# Patient Record
Sex: Male | Born: 1975 | Race: White | Hispanic: No | Marital: Married | Smoking: Former smoker
Health system: Southern US, Community
[De-identification: ages and names within clinical notes are randomized; demographics above are authoritative.]

## PROBLEM LIST (undated history)

## (undated) DIAGNOSIS — I10 Essential (primary) hypertension: Secondary | ICD-10-CM

## (undated) DIAGNOSIS — K219 Gastro-esophageal reflux disease without esophagitis: Secondary | ICD-10-CM

## (undated) HISTORY — PX: OTHER SURGICAL HISTORY: SHX169

---

## 2015-07-17 ENCOUNTER — Other Ambulatory Visit: Payer: Self-pay | Admitting: Internal Medicine

## 2015-07-17 ENCOUNTER — Ambulatory Visit
Admission: RE | Admit: 2015-07-17 | Discharge: 2015-07-17 | Disposition: A | Discharge status: Home / Self Care | Payer: 59 | Source: Ambulatory Visit | Attending: Internal Medicine | Admitting: Internal Medicine

## 2015-07-17 DIAGNOSIS — R600 Localized edema: Secondary | ICD-10-CM

## 2016-12-13 DIAGNOSIS — E291 Testicular hypofunction: Secondary | ICD-10-CM | POA: Diagnosis not present

## 2016-12-13 DIAGNOSIS — Z79899 Other long term (current) drug therapy: Secondary | ICD-10-CM | POA: Diagnosis not present

## 2016-12-20 DIAGNOSIS — Z79899 Other long term (current) drug therapy: Secondary | ICD-10-CM | POA: Diagnosis not present

## 2016-12-20 DIAGNOSIS — E291 Testicular hypofunction: Secondary | ICD-10-CM | POA: Diagnosis not present

## 2017-01-28 IMAGING — US US EXTREM LOW VENOUS*L*
1 series · 13 of 24 positions shown · non-contrast
Comparison: None.

CLINICAL DATA: Left lower extremity pain and edema. Evaluate for
DVT.



[Series 1: us extrem low venous*left* · 13 of 42 slices shown]
[im 1/42]
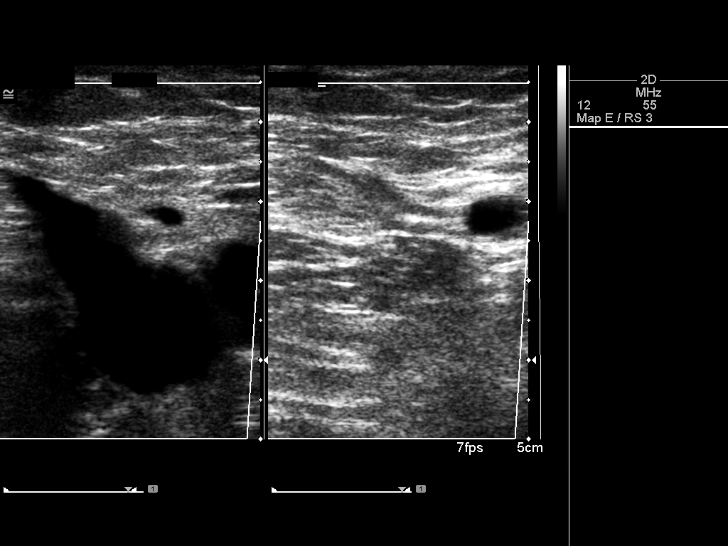
[im 4/42]
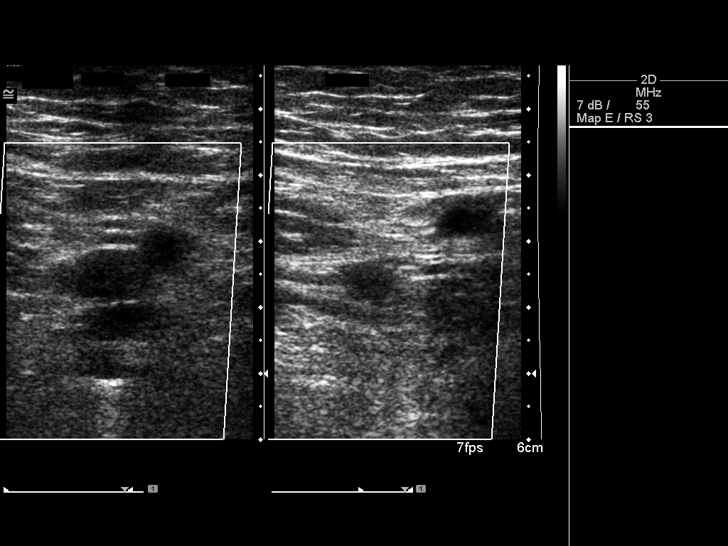
[im 8/42]
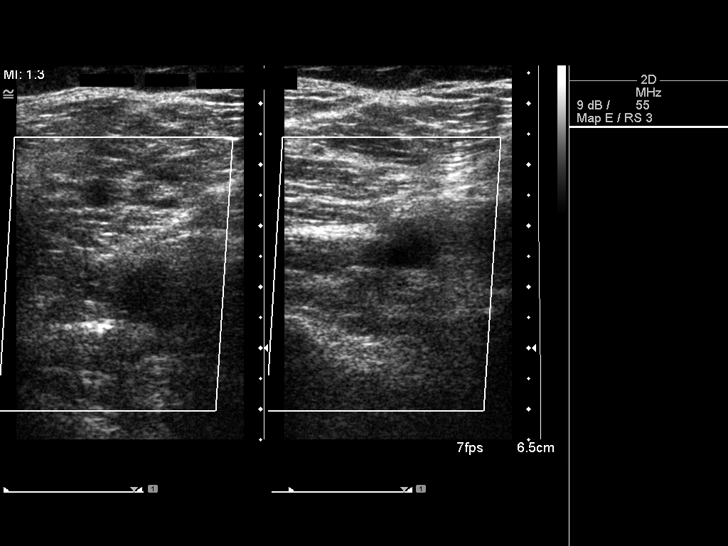
[im 11/42]
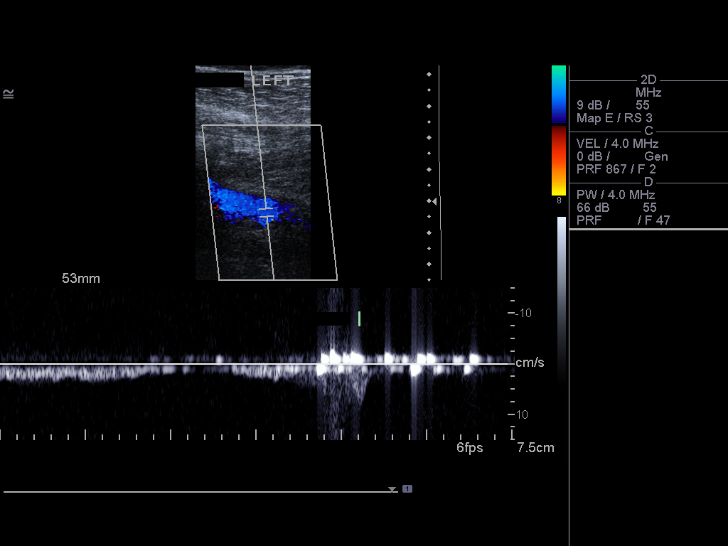
[im 15/42]
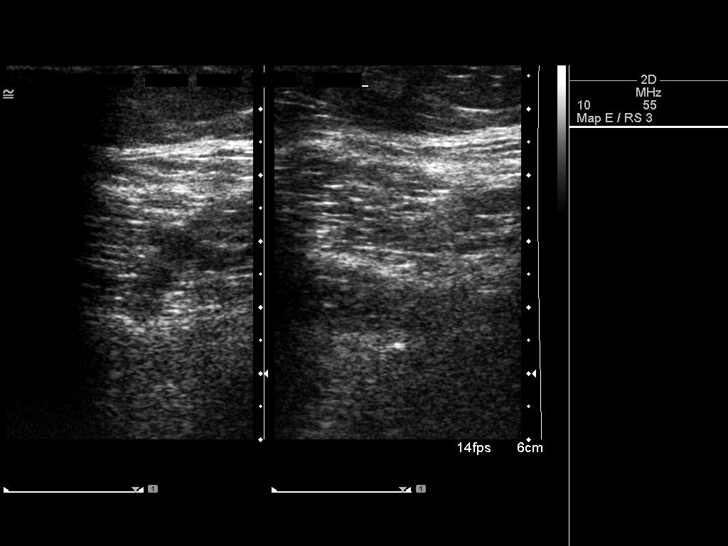
[im 18/42]
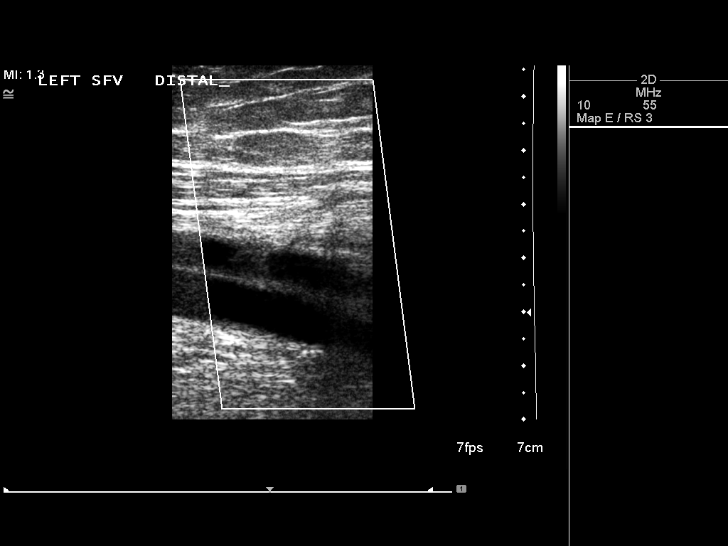
[im 22/42]
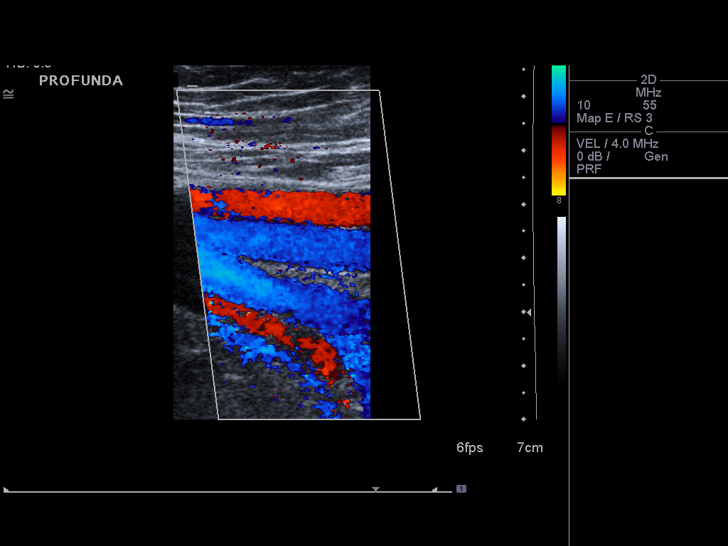
[im 24/42]
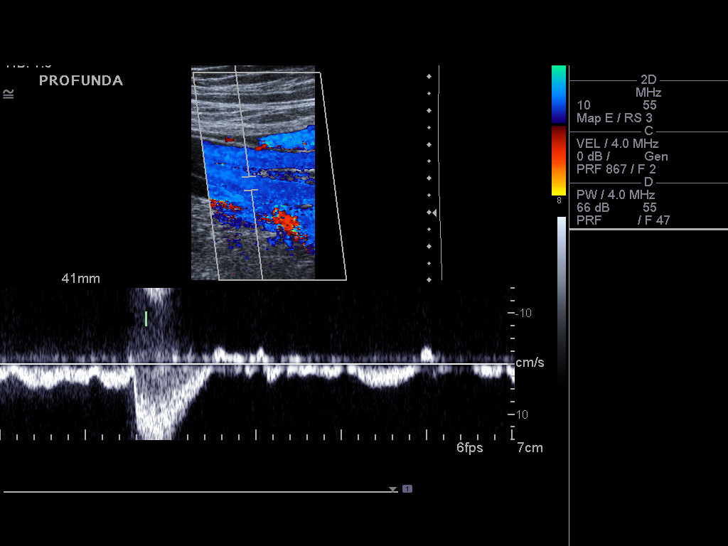
[im 27/42]
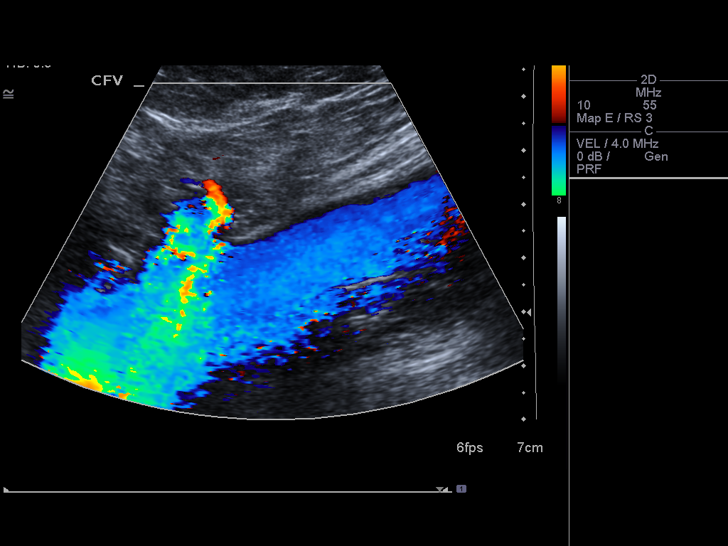
[im 31/42]
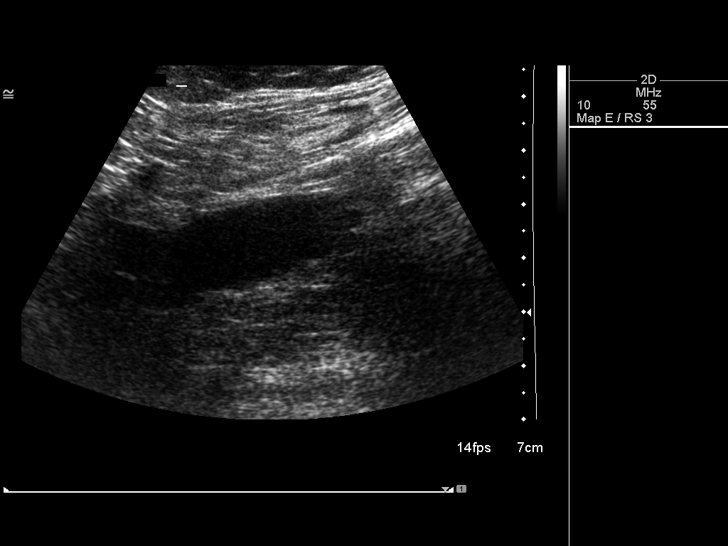
[im 34/42]
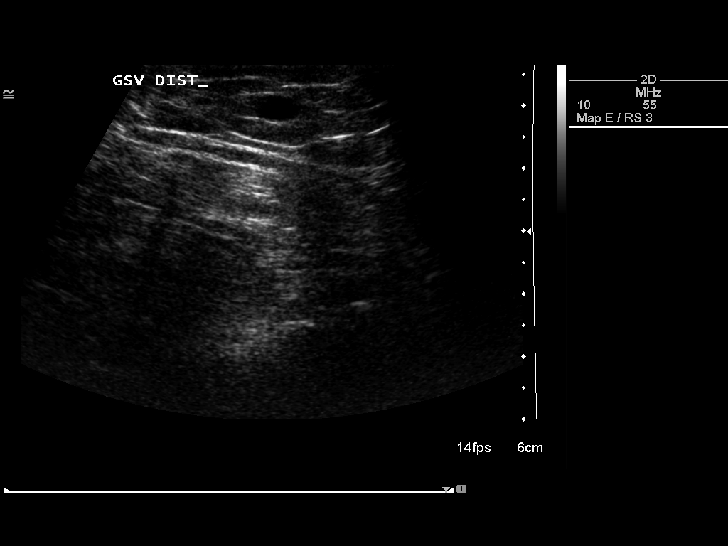
[im 38/42]
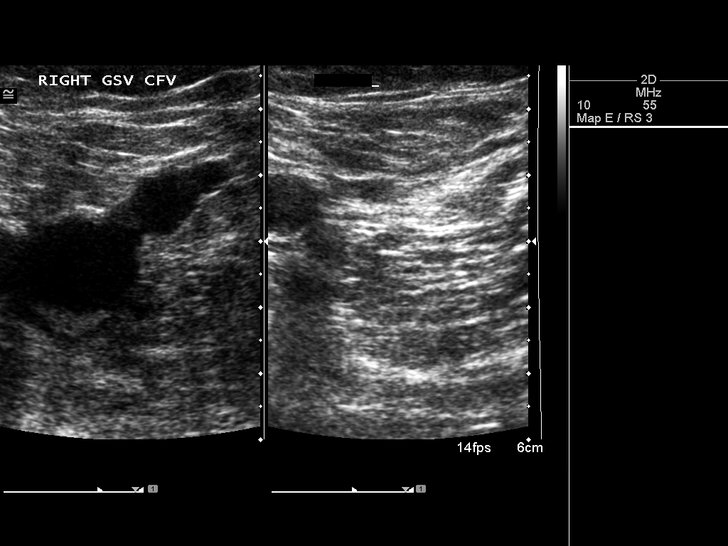
[im 42/42]
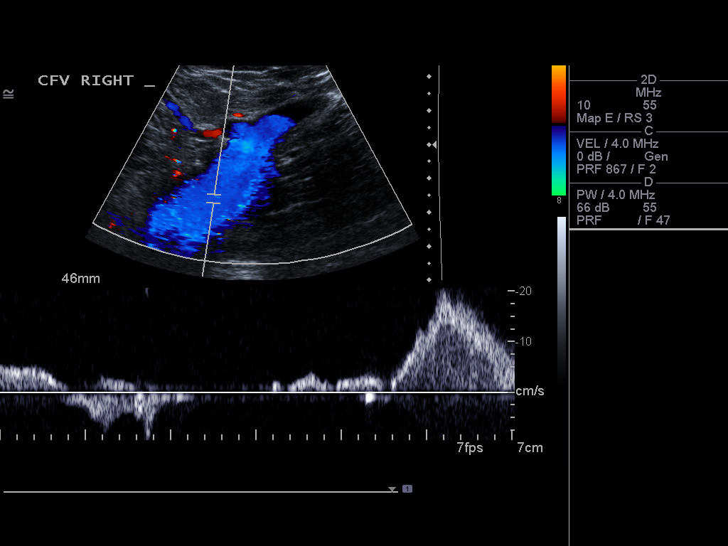

[13 of 24 positions shown; findings below may reference images not displayed]

FINDINGS: Contralateral Common Femoral Vein: Respiratory phasicity is normal
and symmetric with the symptomatic side. No evidence of thrombus.
Normal compressibility.

Common Femoral Vein: No evidence of thrombus. Normal
compressibility, respiratory phasicity and response to augmentation.

Saphenofemoral Junction: No evidence of thrombus. Normal
compressibility and flow on color Doppler imaging.

Profunda Femoral Vein: No evidence of thrombus. Normal
compressibility and flow on color Doppler imaging.

Femoral Vein: No evidence of thrombus. Normal compressibility,
respiratory phasicity and response to augmentation.

Popliteal Vein: No evidence of thrombus. Normal compressibility,
respiratory phasicity and response to augmentation.

Calf Veins: No evidence of thrombus. Normal compressibility and flow
on color Doppler imaging.

Superficial Great Saphenous Vein: No evidence of thrombus. Normal
compressibility and flow on color Doppler imaging.

Venous Reflux:  None.

Other Findings: Note is made of a benign-appearing and non
pathologically enlarged left inguinal lymph node which measures
approximately 1.1 cm in greatest short axis diameter and maintains a
benign fatty hilum (image 33).
IMPRESSION: No evidence of DVT within the left lower extremity.

## 2017-03-08 DIAGNOSIS — Z9189 Other specified personal risk factors, not elsewhere classified: Secondary | ICD-10-CM | POA: Diagnosis not present

## 2017-03-08 DIAGNOSIS — I10 Essential (primary) hypertension: Secondary | ICD-10-CM | POA: Diagnosis not present

## 2017-03-08 DIAGNOSIS — D751 Secondary polycythemia: Secondary | ICD-10-CM | POA: Diagnosis not present

## 2017-03-08 DIAGNOSIS — H66001 Acute suppurative otitis media without spontaneous rupture of ear drum, right ear: Secondary | ICD-10-CM | POA: Diagnosis not present

## 2017-03-08 DIAGNOSIS — R291 Meningismus: Secondary | ICD-10-CM | POA: Diagnosis not present

## 2017-03-08 DIAGNOSIS — G4733 Obstructive sleep apnea (adult) (pediatric): Secondary | ICD-10-CM | POA: Diagnosis not present

## 2017-03-08 DIAGNOSIS — J301 Allergic rhinitis due to pollen: Secondary | ICD-10-CM | POA: Diagnosis not present

## 2017-03-15 DIAGNOSIS — J301 Allergic rhinitis due to pollen: Secondary | ICD-10-CM | POA: Diagnosis not present

## 2017-03-15 DIAGNOSIS — I10 Essential (primary) hypertension: Secondary | ICD-10-CM | POA: Diagnosis not present

## 2017-03-28 DIAGNOSIS — J0101 Acute recurrent maxillary sinusitis: Secondary | ICD-10-CM | POA: Diagnosis not present

## 2017-05-15 DIAGNOSIS — J0101 Acute recurrent maxillary sinusitis: Secondary | ICD-10-CM | POA: Diagnosis not present

## 2017-07-01 DIAGNOSIS — R600 Localized edema: Secondary | ICD-10-CM | POA: Diagnosis not present

## 2017-07-01 DIAGNOSIS — L959 Vasculitis limited to the skin, unspecified: Secondary | ICD-10-CM | POA: Diagnosis not present

## 2017-09-20 DIAGNOSIS — I10 Essential (primary) hypertension: Secondary | ICD-10-CM | POA: Diagnosis not present

## 2017-09-20 DIAGNOSIS — E291 Testicular hypofunction: Secondary | ICD-10-CM | POA: Diagnosis not present

## 2017-09-27 DIAGNOSIS — I10 Essential (primary) hypertension: Secondary | ICD-10-CM | POA: Diagnosis not present

## 2017-09-27 DIAGNOSIS — J301 Allergic rhinitis due to pollen: Secondary | ICD-10-CM | POA: Diagnosis not present

## 2017-09-27 DIAGNOSIS — Z23 Encounter for immunization: Secondary | ICD-10-CM | POA: Diagnosis not present

## 2017-09-27 DIAGNOSIS — Z0001 Encounter for general adult medical examination with abnormal findings: Secondary | ICD-10-CM | POA: Diagnosis not present

## 2017-10-15 DIAGNOSIS — E291 Testicular hypofunction: Secondary | ICD-10-CM | POA: Diagnosis not present

## 2017-10-21 DIAGNOSIS — R740 Nonspecific elevation of levels of transaminase and lactic acid dehydrogenase [LDH]: Secondary | ICD-10-CM | POA: Diagnosis not present

## 2017-10-21 DIAGNOSIS — R7989 Other specified abnormal findings of blood chemistry: Secondary | ICD-10-CM | POA: Diagnosis not present

## 2017-10-21 DIAGNOSIS — K828 Other specified diseases of gallbladder: Secondary | ICD-10-CM | POA: Diagnosis not present

## 2017-10-21 DIAGNOSIS — K8080 Other cholelithiasis without obstruction: Secondary | ICD-10-CM | POA: Diagnosis not present

## 2017-12-05 DIAGNOSIS — J209 Acute bronchitis, unspecified: Secondary | ICD-10-CM | POA: Diagnosis not present

## 2017-12-05 DIAGNOSIS — J019 Acute sinusitis, unspecified: Secondary | ICD-10-CM | POA: Diagnosis not present

## 2018-02-07 DIAGNOSIS — I10 Essential (primary) hypertension: Secondary | ICD-10-CM | POA: Diagnosis not present

## 2018-02-07 DIAGNOSIS — R7301 Impaired fasting glucose: Secondary | ICD-10-CM | POA: Diagnosis not present

## 2018-02-07 DIAGNOSIS — K219 Gastro-esophageal reflux disease without esophagitis: Secondary | ICD-10-CM | POA: Diagnosis not present

## 2018-02-07 DIAGNOSIS — R5383 Other fatigue: Secondary | ICD-10-CM | POA: Diagnosis not present

## 2018-02-17 DIAGNOSIS — M545 Low back pain: Secondary | ICD-10-CM | POA: Diagnosis not present

## 2018-02-17 DIAGNOSIS — K219 Gastro-esophageal reflux disease without esophagitis: Secondary | ICD-10-CM | POA: Diagnosis not present

## 2018-02-17 DIAGNOSIS — I10 Essential (primary) hypertension: Secondary | ICD-10-CM | POA: Diagnosis not present

## 2018-03-30 DIAGNOSIS — J0101 Acute recurrent maxillary sinusitis: Secondary | ICD-10-CM | POA: Diagnosis not present

## 2018-05-30 DIAGNOSIS — R7301 Impaired fasting glucose: Secondary | ICD-10-CM | POA: Diagnosis not present

## 2018-05-30 DIAGNOSIS — I10 Essential (primary) hypertension: Secondary | ICD-10-CM | POA: Diagnosis not present

## 2018-05-30 DIAGNOSIS — K219 Gastro-esophageal reflux disease without esophagitis: Secondary | ICD-10-CM | POA: Diagnosis not present

## 2018-06-06 DIAGNOSIS — I10 Essential (primary) hypertension: Secondary | ICD-10-CM | POA: Diagnosis not present

## 2018-06-06 DIAGNOSIS — K219 Gastro-esophageal reflux disease without esophagitis: Secondary | ICD-10-CM | POA: Diagnosis not present

## 2018-06-23 DIAGNOSIS — G4733 Obstructive sleep apnea (adult) (pediatric): Secondary | ICD-10-CM | POA: Diagnosis not present

## 2018-06-23 DIAGNOSIS — I10 Essential (primary) hypertension: Secondary | ICD-10-CM | POA: Diagnosis not present

## 2018-07-02 DIAGNOSIS — G4733 Obstructive sleep apnea (adult) (pediatric): Secondary | ICD-10-CM | POA: Diagnosis not present

## 2018-07-23 DIAGNOSIS — S233XXA Sprain of ligaments of thoracic spine, initial encounter: Secondary | ICD-10-CM | POA: Diagnosis not present

## 2018-07-23 DIAGNOSIS — S338XXA Sprain of other parts of lumbar spine and pelvis, initial encounter: Secondary | ICD-10-CM | POA: Diagnosis not present

## 2018-07-23 DIAGNOSIS — S134XXA Sprain of ligaments of cervical spine, initial encounter: Secondary | ICD-10-CM | POA: Diagnosis not present

## 2018-08-02 DIAGNOSIS — G4733 Obstructive sleep apnea (adult) (pediatric): Secondary | ICD-10-CM | POA: Diagnosis not present

## 2018-08-15 DIAGNOSIS — Z23 Encounter for immunization: Secondary | ICD-10-CM | POA: Diagnosis not present

## 2018-09-01 DIAGNOSIS — G4733 Obstructive sleep apnea (adult) (pediatric): Secondary | ICD-10-CM | POA: Diagnosis not present

## 2018-10-02 DIAGNOSIS — G4733 Obstructive sleep apnea (adult) (pediatric): Secondary | ICD-10-CM | POA: Diagnosis not present

## 2018-10-19 DIAGNOSIS — B372 Candidiasis of skin and nail: Secondary | ICD-10-CM | POA: Diagnosis not present

## 2018-10-19 DIAGNOSIS — R29898 Other symptoms and signs involving the musculoskeletal system: Secondary | ICD-10-CM | POA: Diagnosis not present

## 2018-10-19 DIAGNOSIS — M7062 Trochanteric bursitis, left hip: Secondary | ICD-10-CM | POA: Diagnosis not present

## 2018-11-01 DIAGNOSIS — G4733 Obstructive sleep apnea (adult) (pediatric): Secondary | ICD-10-CM | POA: Diagnosis not present

## 2018-11-16 DIAGNOSIS — R29898 Other symptoms and signs involving the musculoskeletal system: Secondary | ICD-10-CM | POA: Diagnosis not present

## 2018-11-16 DIAGNOSIS — M7062 Trochanteric bursitis, left hip: Secondary | ICD-10-CM | POA: Diagnosis not present

## 2018-11-16 DIAGNOSIS — B372 Candidiasis of skin and nail: Secondary | ICD-10-CM | POA: Diagnosis not present

## 2018-12-02 DIAGNOSIS — G4733 Obstructive sleep apnea (adult) (pediatric): Secondary | ICD-10-CM | POA: Diagnosis not present

## 2018-12-19 DIAGNOSIS — Z0001 Encounter for general adult medical examination with abnormal findings: Secondary | ICD-10-CM | POA: Diagnosis not present

## 2019-01-02 DIAGNOSIS — G4733 Obstructive sleep apnea (adult) (pediatric): Secondary | ICD-10-CM | POA: Diagnosis not present

## 2019-01-16 DIAGNOSIS — K219 Gastro-esophageal reflux disease without esophagitis: Secondary | ICD-10-CM | POA: Diagnosis not present

## 2019-01-16 DIAGNOSIS — J301 Allergic rhinitis due to pollen: Secondary | ICD-10-CM | POA: Diagnosis not present

## 2019-01-16 DIAGNOSIS — Z0001 Encounter for general adult medical examination with abnormal findings: Secondary | ICD-10-CM | POA: Diagnosis not present

## 2019-01-16 DIAGNOSIS — I10 Essential (primary) hypertension: Secondary | ICD-10-CM | POA: Diagnosis not present

## 2019-01-16 DIAGNOSIS — M545 Low back pain: Secondary | ICD-10-CM | POA: Diagnosis not present

## 2019-01-16 DIAGNOSIS — E8881 Metabolic syndrome: Secondary | ICD-10-CM | POA: Diagnosis not present

## 2019-01-31 DIAGNOSIS — G4733 Obstructive sleep apnea (adult) (pediatric): Secondary | ICD-10-CM | POA: Diagnosis not present

## 2019-02-08 DIAGNOSIS — M25452 Effusion, left hip: Secondary | ICD-10-CM | POA: Diagnosis not present

## 2019-02-08 DIAGNOSIS — R600 Localized edema: Secondary | ICD-10-CM | POA: Diagnosis not present

## 2019-02-08 DIAGNOSIS — M87052 Idiopathic aseptic necrosis of left femur: Secondary | ICD-10-CM | POA: Diagnosis not present

## 2019-02-13 DIAGNOSIS — M87059 Idiopathic aseptic necrosis of unspecified femur: Secondary | ICD-10-CM | POA: Diagnosis not present

## 2019-02-13 DIAGNOSIS — Z6832 Body mass index (BMI) 32.0-32.9, adult: Secondary | ICD-10-CM | POA: Diagnosis not present

## 2019-02-25 DIAGNOSIS — M25552 Pain in left hip: Secondary | ICD-10-CM | POA: Diagnosis not present

## 2019-02-25 DIAGNOSIS — M87859 Other osteonecrosis, unspecified femur: Secondary | ICD-10-CM | POA: Diagnosis not present

## 2019-03-03 DIAGNOSIS — G4733 Obstructive sleep apnea (adult) (pediatric): Secondary | ICD-10-CM | POA: Diagnosis not present

## 2019-03-04 ENCOUNTER — Encounter (HOSPITAL_COMMUNITY): Payer: Self-pay | Admitting: Student

## 2019-03-04 ENCOUNTER — Other Ambulatory Visit: Payer: Self-pay

## 2019-03-04 NOTE — Progress Notes (Signed)
Patient instructed to bring cpap mask and tubing day of surgery.

## 2019-03-04 NOTE — Progress Notes (Addendum)
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 FGaynelle Arabian   DOS EKG.  Pt can proceed with planned procedure barring acute status change, DOS evaluation (same day workup).  VS: There were no vitals taken for this visit.  PROVIDERS: DCaryl Bis MD is PCP last seen 01/24/19   LABS: Labs DOS (all labs ordered are listed, but only abnormal results are displayed)  Labs Reviewed - No data to display   IMAGES:   EKG:   CV:  Past Medical History:  Diagnosis Date  . GERD (gastroesophageal reflux disease)   . Hypertension     Past Surgical History:  Procedure Laterality Date  . wisdom teeth extractoin       MEDICATIONS: No current facility-administered medications for this encounter.    . fluticasone (FLONASE) 50 MCG/ACT nasal spray  . hydrochlorothiazide (HYDRODIURIL) 25 MG tablet  . loratadine (CLARITIN) 10 MG tablet  . losartan (COZAAR) 100 MG tablet  . naproxen sodium (ALEVE) 220 MG tablet  . pantoprazole (PROTONIX) 40 MG tablet  . testosterone cypionate (DEPOTESTOTERONE CYPIONATE) 100 MG/ML injection  . traZODone (DESYREL) 50 MG tablet   JMaia PlanWVail [facility] Center[facility] Testing ([phone]/09/20 2:19 PM

## 2019-03-04 NOTE — H&P (Signed)
TOTAL HIP ADMISSION H&P  Patient is admitted for left total hip arthroplasty.  Subjective:  Chief Complaint: left hip pain  HPI: Zachary James, 43 y.o. male, has a history of pain and functional disability in the left hip(s) due to arthritis and patient has failed non-surgical conservative treatments for greater than 12 weeks to include NSAID's and/or analgesics and activity modification.  Onset of symptoms was abrupt starting 1 years ago with gradually worsening course since that time.The patient noted no past surgery on the left hip(s).  Patient currently rates pain in the left hip at 8 out of 10 with activity. Patient has worsening of pain with activity and weight bearing, pain that interfers with activities of daily living, pain with passive range of motion and crepitus. Patient has evidence of subchondral fracture of the femoral head with collapse of the femoral head by imaging studies. This condition presents safety issues increasing the risk of falls. There is no current active infection.  There are no active problems to display for this patient.  History reviewed. No pertinent past medical history.  History reviewed. No pertinent surgical history.  No current facility-administered medications for this encounter.    Current Outpatient Medications  Medication Sig Dispense Refill Last Dose  . fluticasone (FLONASE) 50 MCG/ACT nasal spray Place 1 spray into both nostrils daily.     . hydrochlorothiazide (HYDRODIURIL) 25 MG tablet Take 25 mg by mouth daily.     Marland Kitchen loratadine (CLARITIN) 10 MG tablet Take 10 mg by mouth daily.     Marland Kitchen losartan (COZAAR) 100 MG tablet Take 100 mg by mouth daily.     . naproxen sodium (ALEVE) 220 MG tablet Take 440 mg by mouth 2 (two) times daily as needed (pain).     . pantoprazole (PROTONIX) 40 MG tablet Take 40 mg by mouth daily.     Marland Kitchen testosterone cypionate (DEPOTESTOTERONE CYPIONATE) 100 MG/ML injection Inject 100 mg into the muscle every 14 (fourteen) days.  For IM use only     . traZODone (DESYREL) 50 MG tablet Take 50-100 mg by mouth at bedtime.      Allergies  Allergen Reactions  . Ace Inhibitors Cough    Social History   Tobacco Use  . Smoking status: Not on file  Substance Use Topics  . Alcohol use: Not on file    History reviewed. No pertinent family history.   Review of Systems  Constitutional: Negative for chills and fever.  HENT: Negative for congestion, sore throat and tinnitus.   Eyes: Negative for double vision, photophobia and pain.  Respiratory: Negative for cough, shortness of breath and wheezing.   Cardiovascular: Negative for chest pain, palpitations and orthopnea.  Gastrointestinal: Negative for heartburn, nausea and vomiting.  Genitourinary: Negative for dysuria, frequency and urgency.  Musculoskeletal: Positive for joint pain.  Neurological: Negative for dizziness, weakness and headaches.    Objective:  Physical Exam  Well nourished and well developed.  General: Alert and oriented x3, cooperative and pleasant, no acute distress.  Head: normocephalic, atraumatic, neck supple.  Eyes: EOMI.  Respiratory: breath sounds clear in all fields, no wheezing, rales, or rhonchi. Cardiovascular: Regular rate and rhythm, no murmurs, gallops or rubs.  Abdomen: non-tender to palpation and soft, normoactive bowel sounds. Musculoskeletal:  Left Hip Exam: ROM: Flexion to 110, Internal Rotation 10 -15, External Rotation 20, and abduction 20-30 with pain during external rotation.  There is no tenderness over the greater trochanter bursa.   Calves soft and nontender. Motor function intact  in LE. Strength 5/5 LE bilaterally. Neuro: Distal pulses 2+. Sensation to light touch intact in LE.  Vital signs in last 24 hours: Blood pressure: 150/92 mmHg  Labs:   There is no height or weight on file to calculate BMI.   Imaging Review Plain radiographs demonstrate severe degenerative joint disease of the left hip(s). The bone  quality appears to be adequate for age and reported activity level.      Assessment/Plan:  End stage arthritis, left hip(s)  The patient history, physical examination, clinical judgement of the provider and imaging studies are consistent with end stage degenerative joint disease of the left hip(s) and total hip arthroplasty is deemed medically necessary. The treatment options including medical management, injection therapy, arthroscopy and arthroplasty were discussed at length. The risks and benefits of total hip arthroplasty were presented and reviewed. The risks due to aseptic loosening, infection, stiffness, dislocation/subluxation,  thromboembolic complications and other imponderables were discussed.  The patient acknowledged the explanation, agreed to proceed with the plan and consent was signed. Patient is being admitted for inpatient treatment for surgery, pain control, PT, OT, prophylactic antibiotics, VTE prophylaxis, progressive ambulation and ADL's and discharge planning.The patient is planning to be discharged home.   Anticipated LOS equal to or greater than 2 midnights due to - Age 38 and older with one or more of the following:  - Obesity  - Expected need for hospital services (PT, OT, Nursing) required for safe  discharge  - Anticipated need for postoperative skilled nursing care or inpatient rehab  - Active co-morbidities: None OR   - Unanticipated findings during/Post Surgery: None  - Patient is a high risk of re-admission due to: None   Therapy Plans: HEP Disposition: Home with wife Planned DVT Prophylaxis: Aspirin 325 mg BID DME needed: None PCP: Gar Ponto, MD TXA: IV Allergies: NKDA Anesthesia Concerns: Sleep apnea BMI: 30.6  - Patient was instructed on what medications to stop prior to surgery. - Follow-up visit in 2 weeks with Dr. Wynelle Link - Begin physical therapy following surgery - Pre-operative lab work as pre-surgical testing - Prescriptions will be  provided in hospital at time of discharge  Theresa Duty, PA-C Orthopedic Surgery [facility] [place]

## 2019-03-04 NOTE — Progress Notes (Signed)
SPOKE W/  _patient      SCREENING SYMPTOMS OF COVID 19:   COUGH--no  RUNNY NOSE--- no  SORE THROAT---no  NASAL CONGESTION----no  SNEEZING----no  SHORTNESS OF BREATH---no  DIFFICULTY BREATHING---no  TEMP >100.4-----no  UNEXPLAINED BODY ACHES------no   HAVE YOU OR ANY FAMILY MEMBER TRAVELLED PAST 14 DAYS OUT OF THE   COUNTY--no- STATE----no COUNTRY----no  HAVE YOU OR ANY FAMILY MEMBER BEEN EXPOSED TO ANYONE WITH COVID 19?  no

## 2019-03-07 ENCOUNTER — Encounter (HOSPITAL_COMMUNITY): Payer: Self-pay | Admitting: Anesthesiology

## 2019-03-07 NOTE — Anesthesia Preprocedure Evaluation (Addendum)
Anesthesia Evaluation  Patient identified by MRN, date of birth, ID band Patient awake    Reviewed: Allergy & Precautions, NPO status , Patient's Chart, lab work & pertinent test results  Airway Mallampati: I  TM Distance: >3 FB Neck ROM: Full    Dental  (+) Teeth Intact, Dental Advisory Given   Pulmonary former smoker,    breath sounds clear to auscultation       Cardiovascular hypertension, Pt. on medications  Rhythm:Regular Rate:Normal     Neuro/Psych negative neurological ROS     GI/Hepatic Neg liver ROS, GERD  ,  Endo/Other  negative endocrine ROS  Renal/GU negative Renal ROS     Musculoskeletal negative musculoskeletal ROS (+)   Abdominal Normal abdominal exam  (+)   Peds  Hematology negative hematology ROS (+)   Anesthesia Other Findings   Reproductive/Obstetrics                            Lab Results  Component Value Date   WBC 4.8 03/08/2019   HGB 14.8 03/08/2019   HCT 42.8 03/08/2019   MCV 94.7 03/08/2019   PLT 165 03/08/2019   EKG: normal sinus rhythm.   Anesthesia Physical Anesthesia Plan  ASA: II  Anesthesia Plan: Spinal   Post-op Pain Management:    Induction: Intravenous  PONV Risk Score and Plan: 2 and Ondansetron, Propofol infusion and Midazolam  Airway Management Planned: Natural Airway and Simple Face Mask  Additional Equipment:   Intra-op Plan:   Post-operative Plan:   Informed Consent: I have reviewed the patients History and Physical, chart, labs and discussed the procedure including the risks, benefits and alternatives for the proposed anesthesia with the patient or authorized representative who has indicated his/her understanding and acceptance.     Dental advisory given  Plan Discussed with: CRNA  Anesthesia Plan Comments:        Anesthesia Quick Evaluation

## 2019-03-08 ENCOUNTER — Encounter (HOSPITAL_COMMUNITY)
Admission: RE | Disposition: A | Discharge status: Home / Self Care | Payer: Self-pay | Source: Home / Self Care | Attending: Orthopedic Surgery

## 2019-03-08 ENCOUNTER — Inpatient Hospital Stay (HOSPITAL_COMMUNITY): Payer: 59 | Admitting: Physician Assistant

## 2019-03-08 ENCOUNTER — Inpatient Hospital Stay (HOSPITAL_COMMUNITY): Payer: 59

## 2019-03-08 ENCOUNTER — Inpatient Hospital Stay (HOSPITAL_COMMUNITY)
Admission: RE | Admit: 2019-03-08 | Discharge: 2019-03-09 | DRG: 470 | Disposition: A | Discharge status: Home / Self Care | Payer: 59 | Attending: Orthopedic Surgery | Admitting: Orthopedic Surgery

## 2019-03-08 ENCOUNTER — Encounter (HOSPITAL_COMMUNITY): Payer: Self-pay | Admitting: *Deleted

## 2019-03-08 ENCOUNTER — Other Ambulatory Visit: Payer: Self-pay

## 2019-03-08 DIAGNOSIS — I10 Essential (primary) hypertension: Secondary | ICD-10-CM | POA: Diagnosis not present

## 2019-03-08 DIAGNOSIS — G4730 Sleep apnea, unspecified: Secondary | ICD-10-CM | POA: Diagnosis present

## 2019-03-08 DIAGNOSIS — M25559 Pain in unspecified hip: Secondary | ICD-10-CM

## 2019-03-08 DIAGNOSIS — Z9989 Dependence on other enabling machines and devices: Secondary | ICD-10-CM

## 2019-03-08 DIAGNOSIS — Z79899 Other long term (current) drug therapy: Secondary | ICD-10-CM | POA: Diagnosis not present

## 2019-03-08 DIAGNOSIS — Z87891 Personal history of nicotine dependence: Secondary | ICD-10-CM

## 2019-03-08 DIAGNOSIS — M87052 Idiopathic aseptic necrosis of left femur: Secondary | ICD-10-CM | POA: Diagnosis present

## 2019-03-08 DIAGNOSIS — K219 Gastro-esophageal reflux disease without esophagitis: Secondary | ICD-10-CM | POA: Diagnosis present

## 2019-03-08 DIAGNOSIS — M87852 Other osteonecrosis, left femur: Principal | ICD-10-CM | POA: Diagnosis present

## 2019-03-08 DIAGNOSIS — Z96642 Presence of left artificial hip joint: Secondary | ICD-10-CM | POA: Diagnosis not present

## 2019-03-08 DIAGNOSIS — Z888 Allergy status to other drugs, medicaments and biological substances status: Secondary | ICD-10-CM | POA: Diagnosis not present

## 2019-03-08 DIAGNOSIS — Z96649 Presence of unspecified artificial hip joint: Secondary | ICD-10-CM

## 2019-03-08 HISTORY — DX: Essential (primary) hypertension: I10

## 2019-03-08 HISTORY — PX: TOTAL HIP ARTHROPLASTY: SHX124

## 2019-03-08 HISTORY — DX: Gastro-esophageal reflux disease without esophagitis: K21.9

## 2019-03-08 LAB — COMPREHENSIVE METABOLIC PANEL
ALT: 28 U/L (ref 0–44)
AST: 21 U/L (ref 15–41)
Albumin: 4.1 g/dL (ref 3.5–5.0)
Alkaline Phosphatase: 49 U/L (ref 38–126)
Anion gap: 10 (ref 5–15)
BUN: 15 mg/dL (ref 6–20)
CO2: 26 mmol/L (ref 22–32)
Calcium: 8.7 mg/dL — ABNORMAL LOW (ref 8.9–10.3)
Chloride: 102 mmol/L (ref 98–111)
Creatinine, Ser: 1.11 mg/dL (ref 0.61–1.24)
GFR calc Af Amer: >60 mL/min (ref >60)
GFR calc non Af Amer: >60 mL/min (ref >60)
Glucose, Bld: 116 mg/dL — ABNORMAL HIGH (ref 70–99)
Potassium: 2.7 mmol/L — CL (ref 3.5–5.1)
Sodium: 138 mmol/L (ref 135–145)
Total Bilirubin: 1 mg/dL (ref 0.3–1.2)
Total Protein: 6.7 g/dL (ref 6.5–8.1)

## 2019-03-08 LAB — CBC
HCT: 42.8 % (ref 39.0–52.0)
Hemoglobin: 14.8 g/dL (ref 13.0–17.0)
MCH: 32.7 pg (ref 26.0–34.0)
MCHC: 34.6 g/dL (ref 30.0–36.0)
MCV: 94.7 fL (ref 80.0–100.0)
Platelets: 165 10*3/uL (ref 150–400)
RBC: 4.52 MIL/uL (ref 4.22–5.81)
RDW: 13.4 % (ref 11.5–15.5)
WBC: 4.8 10*3/uL (ref 4.0–10.5)
nRBC: 0 % (ref 0.0–0.2)

## 2019-03-08 LAB — TYPE AND SCREEN
ABO/RH(D): B POS
Antibody Screen: NEGATIVE

## 2019-03-08 LAB — SURGICAL PCR SCREEN
MRSA, PCR: POSITIVE — AB
Staphylococcus aureus: POSITIVE — AB

## 2019-03-08 LAB — PROTIME-INR
INR: 1.1 (ref 0.8–1.2)
Prothrombin Time: 13.6 seconds (ref 11.4–15.2)

## 2019-03-08 LAB — APTT: aPTT: 28 seconds (ref 24–36)

## 2019-03-08 LAB — ABO/RH: ABO/RH(D): B POS

## 2019-03-08 SURGERY — ARTHROPLASTY, HIP, TOTAL, ANTERIOR APPROACH
Anesthesia: Spinal | Site: Hip | Laterality: Left

## 2019-03-08 MED ORDER — FENTANYL CITRATE (PF) 100 MCG/2ML IJ SOLN
INTRAMUSCULAR | Status: AC
  Filled 2019-03-08: qty 2

## 2019-03-08 MED ORDER — LOSARTAN POTASSIUM 50 MG PO TABS
100.0000 mg | ORAL_TABLET | Freq: Every day | ORAL | Status: DC
  Administered 2019-03-09: 100 mg via ORAL
  Filled 2019-03-08: qty 2

## 2019-03-08 MED ORDER — MEPERIDINE HCL 50 MG/ML IJ SOLN: 6.2500 mg | INTRAMUSCULAR | Status: DC | PRN

## 2019-03-08 MED ORDER — PHENOL 1.4 % MT LIQD
1.0000 | OROMUCOSAL | Status: DC | PRN
  Filled 2019-03-08: qty 177

## 2019-03-08 MED ORDER — PROPOFOL 10 MG/ML IV BOLUS
INTRAVENOUS | Status: AC
  Filled 2019-03-08: qty 20

## 2019-03-08 MED ORDER — PHENYLEPHRINE 40 MCG/ML (10ML) SYRINGE FOR IV PUSH (FOR BLOOD PRESSURE SUPPORT)
PREFILLED_SYRINGE | INTRAVENOUS | Status: DC | PRN
  Administered 2019-03-08: 40 ug via INTRAVENOUS

## 2019-03-08 MED ORDER — METHOCARBAMOL 500 MG PO TABS
500.0000 mg | ORAL_TABLET | Freq: Four times a day (QID) | ORAL | Status: DC | PRN
  Administered 2019-03-08 – 2019-03-09 (×2): 500 mg via ORAL
  Filled 2019-03-08 (×2): qty 1

## 2019-03-08 MED ORDER — TRANEXAMIC ACID-NACL 1000-0.7 MG/100ML-% IV SOLN
1000.0000 mg | Freq: Once | INTRAVENOUS | Status: AC
  Administered 2019-03-08: 13:00:00 1000 mg via INTRAVENOUS
  Filled 2019-03-08: qty 100

## 2019-03-08 MED ORDER — CEFAZOLIN SODIUM-DEXTROSE 2-4 GM/100ML-% IV SOLN
2.0000 g | INTRAVENOUS | Status: AC
  Administered 2019-03-08: 2 g via INTRAVENOUS
  Filled 2019-03-08: qty 100

## 2019-03-08 MED ORDER — MIDAZOLAM HCL 2 MG/2ML IJ SOLN
INTRAMUSCULAR | Status: AC
  Filled 2019-03-08: qty 2

## 2019-03-08 MED ORDER — ONDANSETRON HCL 4 MG/2ML IJ SOLN: 4.0000 mg | Freq: Four times a day (QID) | INTRAMUSCULAR | Status: DC | PRN

## 2019-03-08 MED ORDER — LACTATED RINGERS IV SOLN
INTRAVENOUS | Status: DC
  Administered 2019-03-08: 08:00:00 via INTRAVENOUS

## 2019-03-08 MED ORDER — 0.9 % SODIUM CHLORIDE (POUR BTL) OPTIME
TOPICAL | Status: DC | PRN
  Administered 2019-03-08: 08:00:00 1000 mL

## 2019-03-08 MED ORDER — PROMETHAZINE HCL 25 MG/ML IJ SOLN: 6.2500 mg | INTRAMUSCULAR | Status: DC | PRN

## 2019-03-08 MED ORDER — MORPHINE SULFATE (PF) 2 MG/ML IV SOLN
0.5000 mg | INTRAVENOUS | Status: DC | PRN
  Administered 2019-03-08: 16:00:00 1 mg via INTRAVENOUS
  Filled 2019-03-08: qty 1

## 2019-03-08 MED ORDER — ACETAMINOPHEN 325 MG PO TABS: 325.0000 mg | ORAL_TABLET | Freq: Once | ORAL | Status: DC

## 2019-03-08 MED ORDER — TRAZODONE HCL 50 MG PO TABS
50.0000 mg | ORAL_TABLET | Freq: Every day | ORAL | Status: DC
  Administered 2019-03-08: 23:00:00 100 mg via ORAL
  Filled 2019-03-08: qty 2

## 2019-03-08 MED ORDER — FENTANYL CITRATE (PF) 100 MCG/2ML IJ SOLN
INTRAMUSCULAR | Status: DC | PRN
  Administered 2019-03-08 (×2): 50 ug via INTRAVENOUS

## 2019-03-08 MED ORDER — PROPOFOL 10 MG/ML IV BOLUS
INTRAVENOUS | Status: AC
  Filled 2019-03-08: qty 60

## 2019-03-08 MED ORDER — ONDANSETRON HCL 4 MG PO TABS: 4.0000 mg | ORAL_TABLET | Freq: Four times a day (QID) | ORAL | Status: DC | PRN

## 2019-03-08 MED ORDER — METOCLOPRAMIDE HCL 5 MG/ML IJ SOLN: 5.0000 mg | Freq: Three times a day (TID) | INTRAMUSCULAR | Status: DC | PRN

## 2019-03-08 MED ORDER — CHLORHEXIDINE GLUCONATE CLOTH 2 % EX PADS: 6.0000 | MEDICATED_PAD | Freq: Every day | CUTANEOUS | Status: DC

## 2019-03-08 MED ORDER — BUPIVACAINE IN DEXTROSE 0.75-8.25 % IT SOLN
INTRATHECAL | Status: DC | PRN
  Administered 2019-03-08: 2 mL via INTRATHECAL

## 2019-03-08 MED ORDER — CHLORHEXIDINE GLUCONATE 4 % EX LIQD: 60.0000 mL | Freq: Once | CUTANEOUS | Status: DC

## 2019-03-08 MED ORDER — PANTOPRAZOLE SODIUM 40 MG PO TBEC
40.0000 mg | DELAYED_RELEASE_TABLET | Freq: Every day | ORAL | Status: DC
  Administered 2019-03-09: 08:00:00 40 mg via ORAL
  Filled 2019-03-08: qty 1

## 2019-03-08 MED ORDER — HYDROCODONE-ACETAMINOPHEN 5-325 MG PO TABS
1.0000 | ORAL_TABLET | ORAL | Status: DC | PRN
  Administered 2019-03-08: 23:00:00 2 via ORAL
  Administered 2019-03-08: 13:00:00 1 via ORAL
  Administered 2019-03-08: 17:00:00 2 via ORAL
  Administered 2019-03-09: 05:00:00 1 via ORAL
  Administered 2019-03-09: 10:00:00 2 via ORAL
  Filled 2019-03-08 (×2): qty 2
  Filled 2019-03-08: qty 1
  Filled 2019-03-08: qty 2
  Filled 2019-03-08: qty 1
  Filled 2019-03-08 (×2): qty 2

## 2019-03-08 MED ORDER — ACETAMINOPHEN 160 MG/5ML PO SOLN: 325.0000 mg | Freq: Once | ORAL | Status: DC

## 2019-03-08 MED ORDER — DEXAMETHASONE SODIUM PHOSPHATE 10 MG/ML IJ SOLN: 8.0000 mg | Freq: Once | INTRAMUSCULAR | Status: DC

## 2019-03-08 MED ORDER — ACETAMINOPHEN 10 MG/ML IV SOLN
1000.0000 mg | Freq: Four times a day (QID) | INTRAVENOUS | Status: DC
  Administered 2019-03-08: 1000 mg via INTRAVENOUS
  Filled 2019-03-08: qty 100

## 2019-03-08 MED ORDER — STERILE WATER FOR IRRIGATION IR SOLN
Status: DC | PRN
  Administered 2019-03-08: 2000 mL

## 2019-03-08 MED ORDER — POTASSIUM CHLORIDE 10 MEQ/100ML IV SOLN
10.0000 meq | Freq: Once | INTRAVENOUS | Status: AC
  Administered 2019-03-08: 10 meq via INTRAVENOUS
  Filled 2019-03-08 (×2): qty 100

## 2019-03-08 MED ORDER — ONDANSETRON HCL 4 MG/2ML IJ SOLN
INTRAMUSCULAR | Status: DC | PRN
  Administered 2019-03-08: 4 mg via INTRAVENOUS

## 2019-03-08 MED ORDER — DOCUSATE SODIUM 100 MG PO CAPS
100.0000 mg | ORAL_CAPSULE | Freq: Two times a day (BID) | ORAL | Status: DC
  Administered 2019-03-08 – 2019-03-09 (×2): 100 mg via ORAL
  Filled 2019-03-08 (×2): qty 1

## 2019-03-08 MED ORDER — TRANEXAMIC ACID-NACL 1000-0.7 MG/100ML-% IV SOLN
1000.0000 mg | INTRAVENOUS | Status: AC
  Administered 2019-03-08: 1000 mg via INTRAVENOUS
  Filled 2019-03-08: qty 100

## 2019-03-08 MED ORDER — LACTATED RINGERS IV SOLN: INTRAVENOUS | Status: DC

## 2019-03-08 MED ORDER — METHOCARBAMOL 500 MG IVPB - SIMPLE MED
500.0000 mg | Freq: Four times a day (QID) | INTRAVENOUS | Status: DC | PRN
  Filled 2019-03-08: qty 50

## 2019-03-08 MED ORDER — METOCLOPRAMIDE HCL 5 MG PO TABS: 5.0000 mg | ORAL_TABLET | Freq: Three times a day (TID) | ORAL | Status: DC | PRN

## 2019-03-08 MED ORDER — MENTHOL 3 MG MT LOZG: 1.0000 | LOZENGE | OROMUCOSAL | Status: DC | PRN

## 2019-03-08 MED ORDER — ASPIRIN EC 325 MG PO TBEC
325.0000 mg | DELAYED_RELEASE_TABLET | Freq: Two times a day (BID) | ORAL | Status: DC
  Administered 2019-03-09: 08:00:00 325 mg via ORAL
  Filled 2019-03-08: qty 1

## 2019-03-08 MED ORDER — PROPOFOL 500 MG/50ML IV EMUL
INTRAVENOUS | Status: DC | PRN
  Administered 2019-03-08: 100 ug/kg/min via INTRAVENOUS

## 2019-03-08 MED ORDER — ACETAMINOPHEN 10 MG/ML IV SOLN: 1000.0000 mg | Freq: Once | INTRAVENOUS | Status: DC | PRN

## 2019-03-08 MED ORDER — MUPIROCIN 2 % EX OINT
1.0000 "application " | TOPICAL_OINTMENT | Freq: Two times a day (BID) | CUTANEOUS | Status: DC
  Administered 2019-03-08 – 2019-03-09 (×2): 1 via NASAL
  Filled 2019-03-08: qty 22

## 2019-03-08 MED ORDER — DEXAMETHASONE SODIUM PHOSPHATE 10 MG/ML IJ SOLN
10.0000 mg | Freq: Once | INTRAMUSCULAR | Status: AC
  Administered 2019-03-09: 08:00:00 10 mg via INTRAVENOUS
  Filled 2019-03-08: qty 1

## 2019-03-08 MED ORDER — LORATADINE 10 MG PO TABS
10.0000 mg | ORAL_TABLET | Freq: Every day | ORAL | Status: DC
  Administered 2019-03-09: 08:00:00 10 mg via ORAL
  Filled 2019-03-08: qty 1

## 2019-03-08 MED ORDER — LACTATED RINGERS IV SOLN
INTRAVENOUS | Status: DC
  Administered 2019-03-08: 06:00:00 via INTRAVENOUS

## 2019-03-08 MED ORDER — POVIDONE-IODINE 10 % EX SWAB: 2.0000 "application " | Freq: Once | CUTANEOUS | Status: DC

## 2019-03-08 MED ORDER — ACETAMINOPHEN 500 MG PO TABS
500.0000 mg | ORAL_TABLET | Freq: Four times a day (QID) | ORAL | Status: AC
  Administered 2019-03-08 (×2): 500 mg via ORAL
  Filled 2019-03-08 (×3): qty 1

## 2019-03-08 MED ORDER — CEFAZOLIN SODIUM-DEXTROSE 2-4 GM/100ML-% IV SOLN
2.0000 g | Freq: Four times a day (QID) | INTRAVENOUS | Status: AC
  Administered 2019-03-08 (×2): 2 g via INTRAVENOUS
  Filled 2019-03-08 (×2): qty 100

## 2019-03-08 MED ORDER — HYDROCHLOROTHIAZIDE 25 MG PO TABS
25.0000 mg | ORAL_TABLET | Freq: Every day | ORAL | Status: DC
  Administered 2019-03-09: 08:00:00 25 mg via ORAL
  Filled 2019-03-08: qty 1

## 2019-03-08 MED ORDER — PROPOFOL 10 MG/ML IV BOLUS
INTRAVENOUS | Status: DC | PRN
  Administered 2019-03-08: 20 mg via INTRAVENOUS

## 2019-03-08 MED ORDER — ONDANSETRON HCL 4 MG/2ML IJ SOLN
INTRAMUSCULAR | Status: AC
  Filled 2019-03-08: qty 2

## 2019-03-08 MED ORDER — LIDOCAINE 2% (20 MG/ML) 5 ML SYRINGE
INTRAMUSCULAR | Status: AC
  Filled 2019-03-08: qty 5

## 2019-03-08 MED ORDER — POLYETHYLENE GLYCOL 3350 17 G PO PACK: 17.0000 g | PACK | Freq: Every day | ORAL | Status: DC | PRN

## 2019-03-08 MED ORDER — DEXAMETHASONE SODIUM PHOSPHATE 10 MG/ML IJ SOLN
INTRAMUSCULAR | Status: DC | PRN
  Administered 2019-03-08: 8 mg via INTRAVENOUS

## 2019-03-08 MED ORDER — SODIUM CHLORIDE 0.9 % IV SOLN
INTRAVENOUS | Status: DC
  Administered 2019-03-08: 13:00:00 via INTRAVENOUS

## 2019-03-08 MED ORDER — HYDROMORPHONE HCL 1 MG/ML IJ SOLN: 0.2500 mg | INTRAMUSCULAR | Status: DC | PRN

## 2019-03-08 MED ORDER — DEXAMETHASONE SODIUM PHOSPHATE 10 MG/ML IJ SOLN
INTRAMUSCULAR | Status: AC
  Filled 2019-03-08: qty 1

## 2019-03-08 MED ORDER — BUPIVACAINE-EPINEPHRINE (PF) 0.25% -1:200000 IJ SOLN
INTRAMUSCULAR | Status: AC
  Filled 2019-03-08: qty 30

## 2019-03-08 MED ORDER — FLEET ENEMA 7-19 GM/118ML RE ENEM: 1.0000 | ENEMA | Freq: Once | RECTAL | Status: DC | PRN

## 2019-03-08 MED ORDER — FLUTICASONE PROPIONATE 50 MCG/ACT NA SUSP
1.0000 | Freq: Every day | NASAL | Status: DC
  Administered 2019-03-09: 08:00:00 1 via NASAL
  Filled 2019-03-08: qty 16

## 2019-03-08 MED ORDER — BUPIVACAINE-EPINEPHRINE (PF) 0.25% -1:200000 IJ SOLN
INTRAMUSCULAR | Status: DC | PRN
  Administered 2019-03-08: 30 mL

## 2019-03-08 MED ORDER — MIDAZOLAM HCL 2 MG/2ML IJ SOLN
INTRAMUSCULAR | Status: DC | PRN
  Administered 2019-03-08: 2 mg via INTRAVENOUS

## 2019-03-08 MED ORDER — DIPHENHYDRAMINE HCL 12.5 MG/5ML PO ELIX: 12.5000 mg | ORAL_SOLUTION | ORAL | Status: DC | PRN

## 2019-03-08 MED ORDER — BISACODYL 10 MG RE SUPP: 10.0000 mg | Freq: Every day | RECTAL | Status: DC | PRN

## 2019-03-08 MED ORDER — TRAMADOL HCL 50 MG PO TABS
50.0000 mg | ORAL_TABLET | Freq: Four times a day (QID) | ORAL | Status: DC | PRN
  Administered 2019-03-08: 23:00:00 100 mg via ORAL
  Filled 2019-03-08: qty 2

## 2019-03-08 SURGICAL SUPPLY — 43 items
BAG DECANTER FOR FLEXI CONT (MISCELLANEOUS) ×3 IMPLANT
BAG ZIPLOCK 12X15 (MISCELLANEOUS) IMPLANT
BLADE SAG 18X100X1.27 (BLADE) ×3 IMPLANT
BLADE SURG SZ10 CARB STEEL (BLADE) ×6 IMPLANT
CHLORAPREP W/TINT 26 (MISCELLANEOUS) ×3 IMPLANT
CLOSURE WOUND 1/2 X4 (GAUZE/BANDAGES/DRESSINGS) ×2
COVER PERINEAL POST (MISCELLANEOUS) ×3 IMPLANT
COVER SURGICAL LIGHT HANDLE (MISCELLANEOUS) ×3 IMPLANT
COVER WAND RF STERILE (DRAPES) IMPLANT
CUP ACETBLR 54 OD PINNACLE (Hips) ×3 IMPLANT
DECANTER SPIKE VIAL GLASS SM (MISCELLANEOUS) ×3 IMPLANT
DRAPE STERI IOBAN 125X83 (DRAPES) ×3 IMPLANT
DRAPE U-SHAPE 47X51 STRL (DRAPES) ×6 IMPLANT
DRSG ADAPTIC 3X8 NADH LF (GAUZE/BANDAGES/DRESSINGS) ×3 IMPLANT
DRSG MEPILEX BORDER 4X4 (GAUZE/BANDAGES/DRESSINGS) ×3 IMPLANT
DRSG MEPILEX BORDER 4X8 (GAUZE/BANDAGES/DRESSINGS) ×3 IMPLANT
ELECT BLADE TIP CTD 4 INCH (ELECTRODE) ×3 IMPLANT
ELECT REM PT RETURN 15FT ADLT (MISCELLANEOUS) ×3 IMPLANT
EVACUATOR 1/8 PVC DRAIN (DRAIN) ×3 IMPLANT
GLOVE BIO SURGEON STRL SZ7 (GLOVE) ×3 IMPLANT
GLOVE BIO SURGEON STRL SZ8 (GLOVE) ×3 IMPLANT
GLOVE BIOGEL PI IND STRL 7.0 (GLOVE) ×1 IMPLANT
GLOVE BIOGEL PI IND STRL 8 (GLOVE) ×1 IMPLANT
GLOVE BIOGEL PI INDICATOR 7.0 (GLOVE) ×2
GLOVE BIOGEL PI INDICATOR 8 (GLOVE) ×2
GOWN STRL REUS W/TWL LRG LVL3 (GOWN DISPOSABLE) ×6 IMPLANT
HEAD CERAMIC DELTA 36 PLUS 1.5 (Hips) ×3 IMPLANT
HOLDER FOLEY CATH W/STRAP (MISCELLANEOUS) ×3 IMPLANT
KIT TURNOVER KIT A (KITS) IMPLANT
LINER MARATHON NEUT +4X54X36 (Hips) ×3 IMPLANT
MANIFOLD NEPTUNE II (INSTRUMENTS) ×3 IMPLANT
PACK ANTERIOR HIP CUSTOM (KITS) ×3 IMPLANT
STEM FEMORAL SZ6 HIGH ACTIS (Stem) ×3 IMPLANT
STRIP CLOSURE SKIN 1/2X4 (GAUZE/BANDAGES/DRESSINGS) ×4 IMPLANT
SUT ETHIBOND NAB CT1 #1 30IN (SUTURE) ×3 IMPLANT
SUT MNCRL AB 4-0 PS2 18 (SUTURE) ×3 IMPLANT
SUT STRATAFIX 0 PDS 27 VIOLET (SUTURE) ×3
SUT VIC AB 2-0 CT1 27 (SUTURE) ×4
SUT VIC AB 2-0 CT1 TAPERPNT 27 (SUTURE) ×2 IMPLANT
SUTURE STRATFX 0 PDS 27 VIOLET (SUTURE) ×1 IMPLANT
SYR 50ML LL SCALE MARK (SYRINGE) IMPLANT
TRAY FOLEY MTR SLVR 16FR STAT (SET/KITS/TRAYS/PACK) ×3 IMPLANT
YANKAUER SUCT BULB TIP 10FT TU (MISCELLANEOUS) ×3 IMPLANT

## 2019-03-08 NOTE — Progress Notes (Signed)
Patient up in chair hemovac not charging had pulled out completely. Dressing with small amt drainage. Bethann Punches RN

## 2019-03-08 NOTE — Interval H&P Note (Signed)
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  The various methods of treatment have been discussed with the patient and family. After consideration of risks, benefits and other options for treatment, the patient has consented to  Procedure(s) with comments: [place] (Left) - 183mn as a surgical intervention.  The patient's history has been reviewed, patient examined, no change in status, stable for surgery.  I have reviewed the patient's chart and labs.  Questions were answered to the patient's satisfaction.     FPilar PlateAluisio

## 2019-03-08 NOTE — Anesthesia Procedure Notes (Signed)
Date/Time: 03/08/2019 7:45 AM Performed by: Eben Burow, CRNA Pre-anesthesia Checklist: Patient identified, Emergency Drugs available, Suction available, Patient being monitored and Timeout performed Oxygen Delivery Method: Simple face mask Dental Injury: Teeth and Oropharynx as per pre-operative assessment

## 2019-03-08 NOTE — Op Note (Signed)
OPERATIVE REPORT- TOTAL HIP ARTHROPLASTY   PREOPERATIVE DIAGNOSIS: Osteonecrosis of the Left hip.   POSTOPERATIVE DIAGNOSIS: Osteonecrosis of the Left  hip.   PROCEDURE: Left total hip arthroplasty, anterior approach.   SURGEON: Gaynelle Arabian, MD   ASSISTANT: Griffith Citron, PA-C  ANESTHESIA:  Spinal  ESTIMATED BLOOD LOSS:-450 mL    DRAINS: Hemovac x1.   COMPLICATIONS: None   CONDITION: PACU - hemodynamically stable.   BRIEF CLINICAL NOTE: Zachary James is a 43 y.o. male who has advanced end-  stage osteonecrosis with collapse of the femoral head of their Left  hip with progressively worsening pain and  dysfunction.The patient has failed nonoperative management and presents for  total hip arthroplasty.   PROCEDURE IN DETAIL: After successful administration of spinal  anesthetic, the traction boots for the [facility] bed were placed on both  feet and the patient was placed onto the [facility] bed, boots placed into the leg  holders. The Left hip was then isolated from the perineum with plastic  drapes and prepped and draped in the usual sterile fashion. ASIS and  greater trochanter were marked and a oblique incision was made, starting  at about 1 cm lateral and 2 cm distal to the ASIS and coursing towards  the anterior cortex of the femur. The skin was cut with a 10 blade  through subcutaneous tissue to the level of the fascia overlying the  tensor fascia lata muscle. The fascia was then incised in line with the  incision at the junction of the anterior third and posterior 2/3rd. The  muscle was teased off the fascia and then the interval between the TFL  and the rectus was developed. The Hohmann retractor was then placed at  the top of the femoral neck over the capsule. The vessels overlying the  capsule were cauterized and the fat on top of the capsule was removed.  A Hohmann retractor was then placed anterior underneath the rectus  femoris to give exposure to the entire  anterior capsule. A T-shaped  capsulotomy was performed. The edges were tagged and the femoral head  was identified.       Osteophytes are removed off the superior acetabulum.  The femoral neck was then cut in situ with an oscillating saw. Traction  was then applied to the left lower extremity utilizing the [facility]  traction. The femoral head was then removed. Retractors were placed  around the acetabulum and then circumferential removal of the labrum was  performed. Osteophytes were also removed. Reaming starts at 49 mm to  medialize and  Increased in 2 mm increments to 53 mm. We reamed in  approximately 40 degrees of abduction, 20 degrees anteversion. A 54 mm  pinnacle acetabular shell was then impacted in anatomic position under  fluoroscopic guidance with excellent purchase. We did not need to place  any additional dome screws. A 36 mm neutral + 4 marathon liner was then  placed into the acetabular shell.       The femoral lift was then placed along the lateral aspect of the femur  just distal to the vastus ridge. The leg was  externally rotated and capsule  was stripped off the inferior aspect of the femoral neck down to the  level of the lesser trochanter, this was done with electrocautery. The femur was lifted after this was performed. The  leg was then placed in an extended and adducted position essentially delivering the femur. We also removed the capsule superiorly and  the piriformis from the piriformis fossa to gain excellent exposure of the  proximal femur. Rongeur was used to remove some cancellous bone to get  into the lateral portion of the proximal femur for placement of the  initial starter reamer. The starter broaches was placed  the starter broach  and was shown to go down the center of the canal. Broaching  with the Actis system was then performed starting at size 0  coursing  Up to size 6. A size 6 had excellent torsional and rotational  and axial stability. The trial high  offset neck was then placed  with a 36 + 1.5 trial head. The hip was then reduced. We confirmed that  the stem was in the canal both on AP and lateral x-rays. It also has excellent sizing. The hip was reduced with outstanding stability through full extension and full external rotation.. AP pelvis was taken and the leg lengths were measured and found to be equal. Hip was then dislocated again and the femoral head and neck removed. The  femoral broach was removed. Size 6 Actis stem with a high offset  neck was then impacted into the femur following native anteversion. Has  excellent purchase in the canal. Excellent torsional and rotational and  axial stability. It is confirmed to be in the canal on AP and lateral  fluoroscopic views. The 36 + 1.5 ceramic head was placed and the hip  reduced with outstanding stability. Again AP pelvis was taken and it  confirmed that the leg lengths were equal. The wound was then copiously  irrigated with saline solution and the capsule reattached and repaired  with Ethibond suture. 30 ml of .25% Bupivicaine was  injected into the capsule and into the edge of the tensor fascia lata as well as subcutaneous tissue. The fascia overlying the tensor fascia lata was then closed with a running #1 V-Loc. Subcu was closed with interrupted 2-0 Vicryl and subcuticular running 4-0 Monocryl. Incision was cleaned  and dried. Steri-Strips and a bulky sterile dressing applied. Hemovac  drain was hooked to suction and then the patient was awakened and transported to  recovery in stable condition.        Please note that a surgical assistant was a medical necessity for this procedure to perform it in a safe and expeditious manner. Assistant was necessary to provide appropriate retraction of vital neurovascular structures and to prevent femoral fracture and allow for anatomic placement of the prosthesis.  Gaynelle Arabian, M.D.

## 2019-03-08 NOTE — Anesthesia Postprocedure Evaluation (Signed)
Anesthesia Post Note  Patient: Zachary James  Procedure(s) Performed: TOTAL HIP ARTHROPLASTY ANTERIOR APPROACH (Left Hip)     Patient location during evaluation: PACU Anesthesia Type: Spinal Level of consciousness: oriented and awake and alert Pain management: pain level controlled Vital Signs Assessment: post-procedure vital signs reviewed and stable Respiratory status: spontaneous breathing, respiratory function stable and patient connected to nasal cannula oxygen Cardiovascular status: blood pressure returned to baseline and stable Postop Assessment: no headache, no backache and no apparent nausea or vomiting Anesthetic complications: no    Last Vitals:  Vitals:   03/08/19 1015 03/08/19 1029  BP: 133/80 136/89  Pulse: (!) 57 (!) 56  Resp: 13 18  Temp: (!) 36.4 C 36.4 C  SpO2: 96% 99%    Last Pain:  Vitals:   03/08/19 1029  TempSrc: Oral  PainSc:                  [name]

## 2019-03-08 NOTE — Plan of Care (Signed)
Plan of care 

## 2019-03-08 NOTE — Evaluation (Signed)
Physical Therapy Evaluation Patient Details Name: Zachary James MRN: [id number] DOB: 12/22/1975 Today's Date: 03/08/2019   History of Present Illness  s/p DA L THA  Clinical Impression  Pt is s/p THA resulting in the deficits listed below (see PT Problem List). Pt amb 40' with RW and min assist; will review HEP, stairs tomorrow and pt should be ready for d/c   Pt will benefit from skilled PT to increase their independence and safety with mobility to allow discharge to the venue listed below.      Follow Up Recommendations Follow surgeon's recommendation for DC plan and follow-up therapies    Equipment Recommendations  3in1 (PT)(?)    Recommendations for Other Services       Precautions / Restrictions Precautions Precautions: Fall      Mobility  Bed Mobility Overal bed mobility: Needs Assistance Bed Mobility: Supine to Sit     Supine to sit: Min assist     General bed mobility comments: assist with LLE  Transfers Overall transfer level: Needs assistance Equipment used: Rolling walker (2 wheeled) Transfers: Sit to/from Stand           General transfer comment: cues for hand placement and LLE position  Ambulation/Gait Ambulation/Gait assistance: Min assist;Min guard Gait Distance (Feet): 40 Feet Assistive device: Rolling walker (2 wheeled) Gait Pattern/deviations: Step-to pattern     General Gait Details: cues for sequence and RW safety  Stairs            Wheelchair Mobility    Modified Rankin (Stroke Patients Only)       Balance                                             Pertinent Vitals/Pain Pain Assessment: 0-10 Pain Score: 3  Pain Location: L hip Pain Descriptors / Indicators: Discomfort Pain Intervention(s): Monitored during session;Limited activity within patient's tolerance    Home Living Family/patient expects to be discharged to:: Private residence Living Arrangements: Spouse/significant other   Type of  Home: House Home Access: Stairs to enter   Technical brewer of Steps: 1  Home Layout: One level Home Equipment: Environmental consultant - 2 wheels;Shower seat;Cane - single point      Prior Function Level of Independence: Independent               Hand Dominance        Extremity/Trunk Assessment   Upper Extremity Assessment Upper Extremity Assessment: Overall WFL for tasks assessed    Lower Extremity Assessment Lower Extremity Assessment: LLE deficits/detail LLE Deficits / Details: ankle WFL; knee and hip 2+/5       Communication   Communication: No difficulties  Cognition Arousal/Alertness: Awake/alert Behavior During Therapy: WFL for tasks assessed/performed Overall Cognitive Status: Within Functional Limits for tasks assessed                                        General Comments      Exercises Total Joint Exercises Ankle Circles/Pumps: AROM;Both;10 reps Quad Sets: AROM;Both;10 reps   Assessment/Plan    PT Assessment Patient needs continued PT services  PT Problem List Decreased strength;Decreased activity tolerance;Decreased mobility;Decreased knowledge of use of DME;Pain       PT Treatment Interventions DME instruction;Gait training;Functional mobility training;Therapeutic exercise;Therapeutic activities;Patient/family education;Stair training  PT Goals (Current goals can be found in the Care Plan section)  Acute Rehab PT Goals Patient Stated Goal: less pain and be able to pick up left leg PT Goal Formulation: With patient Time For Goal Achievement: 03/15/19 Potential to Achieve Goals: Good    Frequency 7X/week   Barriers to discharge        Co-evaluation               AM-PAC PT "6 Clicks" Mobility  Outcome Measure Help needed turning from your back to your side while in a flat bed without using bedrails?: A Little Help needed moving from lying on your back to sitting on the side of a flat bed without using bedrails?: A  Little Help needed moving to and from a bed to a chair (including a wheelchair)?: A Little Help needed standing up from a chair using your arms (e.g., wheelchair or bedside chair)?: A Little Help needed to walk in hospital room?: A Little Help needed climbing 3-5 steps with a railing? : A Little 6 Click Score: 18    End of Session Equipment Utilized During Treatment: Gait belt Activity Tolerance: Patient tolerated treatment well Patient left: in chair;with call bell/phone within reach Nurse Communication: Mobility status PT Visit Diagnosis: Difficulty in walking, not elsewhere classified (R26.2)    Time: 0347-4259 PT Time Calculation (min) (ACUTE ONLY): 28 min   Charges:   PT Evaluation $PT Eval Low Complexity: 1 Low PT Treatments $Gait Training: 8-22 mins        Kenyon Ana, PT  Pager: [phone] Acute Rehab Dept [facility]): [phone]   03/08/2019   [facility] 03/08/2019, 2:08 PM

## 2019-03-08 NOTE — Addendum Note (Signed)
Addendum  created 03/08/19 1136 by Eben Burow, CRNA   Attestation recorded in [place] filed

## 2019-03-08 NOTE — Discharge Instructions (Signed)
Dr. Gaynelle Arabian Total Joint Specialist [facility] [address], [place], [place] [address] [phone]  ANTERIOR APPROACH TOTAL HIP REPLACEMENT POSTOPERATIVE DIRECTIONS   Hip Rehabilitation, Guidelines Following Surgery  The results of a hip operation are greatly improved after range of motion and muscle strengthening exercises. Follow all safety measures which are given to protect your hip. If any of these exercises cause increased pain or swelling in your joint, decrease the amount until you are comfortable again. Then slowly increase the exercises. Call your caregiver if you have problems or questions.   HOME CARE INSTRUCTIONS  Remove items at home which could result in a fall. This includes throw rugs or furniture in walking pathways.   ICE to the affected hip every three hours for 30 minutes at a time and then as needed for pain and swelling.  Continue to use ice on the hip for pain and swelling from surgery. You may notice swelling that will progress down to the foot and ankle.  This is normal after surgery.  Elevate the leg when you are not up walking on it.    Continue to use the breathing machine which will help keep your temperature down.  It is common for your temperature to cycle up and down following surgery, especially at night when you are not up moving around and exerting yourself.  The breathing machine keeps your lungs expanded and your temperature down.   DIET You may resume your previous home diet once your are discharged from the hospital.  DRESSING / WOUND CARE / SHOWERING You may start showering on 4/16  but do not submerge the incision under water. Just pat the incision dry and apply a dry gauze dressing on daily. Change the surgical dressing daily and reapply a dry dressing each time.  ACTIVITY Walk with your walker as instructed. Use walker as long as suggested by your caregivers. Avoid periods of inactivity such as sitting longer than an  hour when not asleep. This helps prevent blood clots.  You may resume a sexual relationship in one month or when given the OK by your doctor.  You may return to work once you are cleared by your doctor.  Do not drive a car for 6 weeks or until released by you surgeon.  Do not drive while taking narcotics.  POSTOPERATIVE CONSTIPATION PROTOCOL Constipation - defined medically as fewer than three stools per week and severe constipation as less than one stool per week.  One of the most common issues patients have following surgery is constipation.  Even if you have a regular bowel pattern at home, your normal regimen is likely to be disrupted due to multiple reasons following surgery.  Combination of anesthesia, postoperative narcotics, change in appetite and fluid intake all can affect your bowels.  In order to avoid complications following surgery, here are some recommendations in order to help you during your recovery period.  Colace (docusate) - Pick up an over-the-counter form of Colace or another stool softener and take twice a day as long as you are requiring postoperative pain medications.  Take with a full glass of water daily.  If you experience loose stools or diarrhea, hold the colace until you stool forms back up.  If your symptoms do not get better within 1 week or if they get worse, check with your doctor.  Dulcolax (bisacodyl) - Pick up over-the-counter and take as directed by the product packaging as needed to assist with the movement of  your bowels.  Take with a full glass of water.  Use this product as needed if not relieved by Colace only.   MiraLax (polyethylene glycol) - Pick up over-the-counter to have on hand.  MiraLax is a solution that will increase the amount of water in your bowels to assist with bowel movements.  Take as directed and can mix with a glass of water, juice, soda, coffee, or tea.  Take if you go more than two days without a movement. Do not use MiraLax more than  once per day. Call your doctor if you are still constipated or irregular after using this medication for 7 days in a row.  If you continue to have problems with postoperative constipation, please contact the office for further assistance and recommendations.  If you experience "the worst abdominal pain ever" or develop nausea or vomiting, please contact the office immediatly for further recommendations for treatment.  ITCHING  If you experience itching with your medications, try taking only a single pain pill, or even half a pain pill at a time.  You can also use Benadryl over the counter for itching or also to help with sleep.   TED HOSE STOCKINGS Wear the elastic stockings on both legs for three weeks following surgery during the day but you may remove then at night for sleeping.  MEDICATIONS See your medication summary on the After Visit Summary that the nursing staff will review with you prior to discharge.  You may have some home medications which will be placed on hold until you complete the course of blood thinner medication.  It is important for you to complete the blood thinner medication as prescribed by your surgeon.  Continue your approved medications as instructed at time of discharge.  PRECAUTIONS If you experience chest pain or shortness of breath - call 911 immediately for transfer to the hospital emergency department.  If you develop a fever greater that 101 F, purulent drainage from wound, increased redness or drainage from wound, foul odor from the wound/dressing, or calf pain - CONTACT YOUR SURGEON.                                                   FOLLOW-UP APPOINTMENTS Make sure you keep all of your appointments after your operation with your surgeon and caregivers. You should call the office at the above phone number and make an appointment for approximately two weeks after the date of your surgery or on the date instructed by your surgeon outlined in the "After Visit  Summary".  RANGE OF MOTION AND STRENGTHENING EXERCISES  These exercises are designed to help you keep full movement of your hip joint. Follow your caregiver's or physical therapist's instructions. Perform all exercises about fifteen times, three times per day or as directed. Exercise both hips, even if you have had only one joint replacement. These exercises can be done on a training (exercise) mat, on the floor, on a table or on a bed. Use whatever works the best and is most comfortable for you. Use music or television while you are exercising so that the exercises are a pleasant break in your day. This will make your life better with the exercises acting as a break in routine you can look forward to.  Lying on your back, slowly slide your foot toward your buttocks, raising your  knee up off the floor. Then slowly slide your foot back down until your leg is straight again.  Lying on your back spread your legs as far apart as you can without causing discomfort.  Lying on your side, raise your upper leg and foot straight up from the floor as far as is comfortable. Slowly lower the leg and repeat.  Lying on your back, tighten up the muscle in the front of your thigh (quadriceps muscles). You can do this by keeping your leg straight and trying to raise your heel off the floor. This helps strengthen the largest muscle supporting your knee.  Lying on your back, tighten up the muscles of your buttocks both with the legs straight and with the knee bent at a comfortable angle while keeping your heel on the floor.   IF YOU ARE TRANSFERRED TO A SKILLED REHAB FACILITY If the patient is transferred to a skilled rehab facility following release from the hospital, a list of the current medications will be sent to the facility for the patient to continue.  When discharged from the skilled rehab facility, please have the facility set up the patient's Impact prior to being released. Also, the skilled  facility will be responsible for providing the patient with their medications at time of release from the facility to include their pain medication, the muscle relaxants, and their blood thinner medication. If the patient is still at the rehab facility at time of the two week follow up appointment, the skilled rehab facility will also need to assist the patient in arranging follow up appointment in our office and any transportation needs.  MAKE SURE YOU:  Understand these instructions.  Get help right away if you are not doing well or get worse.    Pick up stool softner and laxative for home use following surgery while on pain medications. Do not submerge incision under water. Please use good hand washing techniques while changing dressing each day. May shower starting three days after surgery. Please use a clean towel to pat the incision dry following showers. Continue to use ice for pain and swelling after surgery. Do not use any lotions or creams on the incision until instructed by your surgeon.

## 2019-03-08 NOTE — Transfer of Care (Signed)
Immediate Anesthesia Transfer of Care Note  Patient: Zachary James  Procedure(s) Performed: TOTAL HIP ARTHROPLASTY ANTERIOR APPROACH (Left Hip)  Patient Location: PACU  Anesthesia Type:Spinal  Level of Consciousness: awake, alert  and oriented  Airway & Oxygen Therapy: Patient Spontanous Breathing and Patient connected to face mask oxygen  Post-op Assessment: Report given to RN and Post -op Vital signs reviewed and stable  Post vital signs: Reviewed and stable  Last Vitals:  Vitals Value Taken Time  BP 112/75 03/08/2019  9:37 AM  Temp    Pulse 63 03/08/2019  9:38 AM  Resp 18 03/08/2019  9:38 AM  SpO2 100 % 03/08/2019  9:38 AM  Vitals shown include unvalidated device data.  Last Pain:  Vitals:   03/08/19 0555  TempSrc: Oral         Complications: No apparent anesthesia complications

## 2019-03-08 NOTE — Anesthesia Procedure Notes (Signed)
Spinal  Start time: 03/08/2019 7:42 AM End time: 03/08/2019 7:44 AM Staffing Anesthesiologist: [name], MD Performed: anesthesiologist  Preanesthetic Checklist Completed: patient identified, site marked, surgical consent, pre-op evaluation, timeout performed, IV checked, risks and benefits discussed and monitors and equipment checked Spinal Block Patient position: sitting Prep: site prepped and draped and DuraPrep Location: L3-4 Injection technique: single-shot Needle Needle type: Pencan  Needle gauge: 24 G Needle length: 10 cm Needle insertion depth: 10 cm Additional Notes Patient tolerated well. No immediate complications.

## 2019-03-09 ENCOUNTER — Encounter (HOSPITAL_COMMUNITY): Payer: Self-pay | Admitting: Orthopedic Surgery

## 2019-03-09 LAB — BASIC METABOLIC PANEL
Anion gap: 9 (ref 5–15)
BUN: 12 mg/dL (ref 6–20)
CO2: 25 mmol/L (ref 22–32)
Calcium: 8.2 mg/dL — ABNORMAL LOW (ref 8.9–10.3)
Chloride: 104 mmol/L (ref 98–111)
Creatinine, Ser: 1.01 mg/dL (ref 0.61–1.24)
GFR calc Af Amer: >60 mL/min (ref >60)
GFR calc non Af Amer: >60 mL/min (ref >60)
Glucose, Bld: 125 mg/dL — ABNORMAL HIGH (ref 70–99)
Potassium: 3.3 mmol/L — ABNORMAL LOW (ref 3.5–5.1)
Sodium: 138 mmol/L (ref 135–145)

## 2019-03-09 LAB — CBC
HCT: 36.5 % — ABNORMAL LOW (ref 39.0–52.0)
Hemoglobin: 12.5 g/dL — ABNORMAL LOW (ref 13.0–17.0)
MCH: 32.3 pg (ref 26.0–34.0)
MCHC: 34.2 g/dL (ref 30.0–36.0)
MCV: 94.3 fL (ref 80.0–100.0)
Platelets: 160 10*3/uL (ref 150–400)
RBC: 3.87 MIL/uL — ABNORMAL LOW (ref 4.22–5.81)
RDW: 13.5 % (ref 11.5–15.5)
WBC: 9.7 10*3/uL (ref 4.0–10.5)
nRBC: 0 % (ref 0.0–0.2)

## 2019-03-09 MED ORDER — POTASSIUM CHLORIDE CRYS ER 20 MEQ PO TBCR
40.0000 meq | EXTENDED_RELEASE_TABLET | Freq: Once | ORAL | Status: AC
  Administered 2019-03-09: 40 meq via ORAL
  Filled 2019-03-09: qty 2

## 2019-03-09 MED ORDER — ASPIRIN 325 MG PO TBEC: 325.0000 mg | DELAYED_RELEASE_TABLET | Freq: Two times a day (BID) | ORAL | 0 refills | Status: AC

## 2019-03-09 MED ORDER — HYDROCODONE-ACETAMINOPHEN 5-325 MG PO TABS: 1.0000 | ORAL_TABLET | Freq: Four times a day (QID) | ORAL | 0 refills | Status: AC | PRN

## 2019-03-09 MED ORDER — METHOCARBAMOL 500 MG PO TABS: 500.0000 mg | ORAL_TABLET | Freq: Four times a day (QID) | ORAL | 0 refills | Status: AC | PRN

## 2019-03-09 MED ORDER — TRAMADOL HCL 50 MG PO TABS: 50.0000 mg | ORAL_TABLET | Freq: Four times a day (QID) | ORAL | 0 refills | Status: AC | PRN

## 2019-03-09 NOTE — Progress Notes (Signed)
Physical Therapy Treatment Patient Details Name: Zachary James MRN: [id number] DOB: [birth year] Today's Date: 03/09/2019    History of Present Illness s/p DA L THA    PT Comments    Instructed patient in HEP and ambulation. Ready to DC  Follow Up Recommendations  Follow surgeon's recommendation for DC plan and follow-up therapies     Equipment Recommendations  3in1 (PT)    Recommendations for Other Services       Precautions / Restrictions Precautions Precautions: Fall Restrictions Other Position/Activity Restrictions: WBAT    Mobility  Bed Mobility   Bed Mobility: Supine to Sit;Sit to Supine     Supine to sit: Supervision Sit to supine: Supervision   General bed mobility comments: demonstrated use of gait belt as leg lifter  Transfers Overall transfer level: Needs assistance Equipment used: Rolling walker (2 wheeled) Transfers: Sit to/from Stand Sit to Stand: Supervision         General transfer comment: cues for hand placement and LLE position  Ambulation/Gait Ambulation/Gait assistance: Supervision Gait Distance (Feet): 200 Feet Assistive device: Rolling walker (2 wheeled) Gait Pattern/deviations: Step-through pattern;Step-to pattern     General Gait Details: cues for sequence, RW safety and posiiton   Stairs             Wheelchair Mobility    Modified Rankin (Stroke Patients Only)       Balance                                            Cognition Arousal/Alertness: Awake/alert                                            Exercises Total Joint Exercises Ankle Circles/Pumps: AROM;Both;10 reps Quad Sets: AROM;Both;10 reps Short Arc Quad: AROM Heel Slides: AAROM;Left;10 reps Hip ABduction/ADduction: AAROM;Left;10 reps Long Arc Quad: AROM;Left;10 reps    General Comments        Pertinent Vitals/Pain Pain Score: 4  Pain Location: L hip Pain Descriptors / Indicators:  Discomfort;Tightness;Sore Pain Intervention(s): Monitored during session;Premedicated before session;Ice applied    Home Living                      Prior Function            PT Goals (current goals can now be found in the care plan section) Progress towards PT goals: Progressing toward goals    Frequency    7X/week      PT Plan Current plan remains appropriate    Co-evaluation              AM-PAC PT "6 Clicks" Mobility   Outcome Measure  Help needed turning from your back to your side while in a flat bed without using bedrails?: A Little Help needed moving from lying on your back to sitting on the side of a flat bed without using bedrails?: A Little Help needed moving to and from a bed to a chair (including a wheelchair)?: A Little Help needed standing up from a chair using your arms (e.g., wheelchair or bedside chair)?: A Little Help needed to walk in hospital room?: A Little Help needed climbing 3-5 steps with a railing? : A Little 6 Click Score: 18    End of  Session   Activity Tolerance: Patient tolerated treatment well Patient left: in chair;with call bell/phone within reach Nurse Communication: Mobility status PT Visit Diagnosis: Difficulty in walking, not elsewhere classified (R26.2)     Time: 1834-3735 PT Time Calculation (min) (ACUTE ONLY): 38 min  Charges:  $Gait Training: 8-22 mins $Therapeutic Exercise: 8-22 mins $Self Care/Home Management: [place] Pager [phone] Office [phone]    Claretha Cooper 03/09/2019, 4:05 PM

## 2019-03-09 NOTE — Plan of Care (Signed)
  Problem: Clinical Measurements: Goal: Will remain free from infection Outcome: Progressing Goal: Respiratory complications will improve Outcome: Progressing Goal: Cardiovascular complication will be avoided Outcome: Progressing   Problem: Pain Managment: Goal: General experience of comfort will improve Outcome: Progressing   Problem: Safety: Goal: Ability to remain free from injury will improve Outcome: Progressing

## 2019-03-09 NOTE — Progress Notes (Signed)
Discharge paperwork discussed with pt at the bedside. He demonstrated understanding. Pt was escorted in stable condition by wheelchair to main lobby.

## 2019-03-09 NOTE — Plan of Care (Signed)
Pt alert and oriented, doing well this am.  Pain managed with PO pain meds.  Plan to d/c home today per MD order.  RN will monitor.

## 2019-03-09 NOTE — Progress Notes (Signed)
Physical Therapy Treatment Patient Details Name: Zachary James MRN: [id number] DOB: [birth year] Today's Date: 03/09/2019    History of Present Illness s/p DA L THA    PT Comments    Pt progressing toward goals; a little more pain and soreness today as anticipated,  Reviewed gait and up/down one step; defer HEP until pm; pt should be ready to d/c after second session   Follow Up Recommendations  Follow surgeon's recommendation for DC plan and follow-up therapies     Equipment Recommendations  3in1 (PT)    Recommendations for Other Services       Precautions / Restrictions Precautions Precautions: Fall Restrictions Weight Bearing Restrictions: No Other Position/Activity Restrictions: WBAT    Mobility  Bed Mobility               General bed mobility comments: in recliner; demonstrated use of gait belt as leg lifter  Transfers Overall transfer level: Needs assistance Equipment used: Rolling walker (2 wheeled) Transfers: Sit to/from Stand Sit to Stand: Supervision         General transfer comment: cues for hand placement and LLE position  Ambulation/Gait Ambulation/Gait assistance: Min guard;Supervision Gait Distance (Feet): 90 Feet Assistive device: Rolling walker (2 wheeled) Gait Pattern/deviations: Step-to pattern     General Gait Details: cues for sequence, RW safety and posiiton   Stairs Stairs: Yes Stairs assistance: Min assist;Min guard Stair Management: Step to pattern;With walker;Backwards Number of Stairs: 1 General stair comments: cues for sequence and technique   Wheelchair Mobility    Modified Rankin (Stroke Patients Only)       Balance                                            Cognition Arousal/Alertness: Awake/alert Behavior During Therapy: WFL for tasks assessed/performed Overall Cognitive Status: Within Functional Limits for tasks assessed                                         Exercises      General Comments        Pertinent Vitals/Pain Pain Assessment: 0-10 Pain Score: 6  Pain Location: L hip Pain Descriptors / Indicators: Discomfort;Tightness;Sore Pain Intervention(s): Limited activity within patient's tolerance;Monitored during session;Premedicated before session;Ice applied    Home Living                      Prior Function            PT Goals (current goals can now be found in the care plan section) Acute Rehab PT Goals Patient Stated Goal: less pain and be able to pick up left leg PT Goal Formulation: With patient Time For Goal Achievement: 03/15/19 Potential to Achieve Goals: Good Progress towards PT goals: Progressing toward goals    Frequency    7X/week      PT Plan Current plan remains appropriate    Co-evaluation              AM-PAC PT "6 Clicks" Mobility   Outcome Measure  Help needed turning from your back to your side while in a flat bed without using bedrails?: A Little Help needed moving from lying on your back to sitting on the side of a flat bed without using bedrails?: A Little  Help needed moving to and from a bed to a chair (including a wheelchair)?: A Little Help needed standing up from a chair using your arms (e.g., wheelchair or bedside chair)?: A Little Help needed to walk in hospital room?: A Little Help needed climbing 3-5 steps with a railing? : A Little 6 Click Score: 18    End of Session Equipment Utilized During Treatment: Gait belt Activity Tolerance: Patient tolerated treatment well Patient left: in chair;with call bell/phone within reach Nurse Communication: Mobility status PT Visit Diagnosis: Difficulty in walking, not elsewhere classified (R26.2)     Time: 1005-1030 PT Time Calculation (min) (ACUTE ONLY): 25 min  Charges:  $Gait Training: 23-37 mins                     Kenyon Ana, PT  Pager: [phone] Acute Rehab Dept [facility]):  [phone]   03/09/2019    [facility] 03/09/2019, 10:35 AM

## 2019-03-09 NOTE — Progress Notes (Signed)
   Subjective: 1 Day Post-Op Procedure(s) (LRB): TOTAL HIP ARTHROPLASTY ANTERIOR APPROACH (Left) Patient reports pain as moderate in thigh but groin pain is gone  Plan is to go Home after hospital stay.  Objective: Vital signs in last 24 hours: Temp:  [97.4 F (36.3 C)-98.6 F (37 C)] 98.2 F (36.8 C) (04/14 0502) Pulse Rate:  [50-102] 66 (04/14 0502) Resp:  [10-18] 17 (04/14 0502) BP: (109-155)/(74-106) 135/74 (04/14 0502) SpO2:  [96 %-100 %] 98 % (04/14 0502) Weight:  [096 kg] 118 kg (04/13 1029)  Intake/Output from previous day:  Intake/Output Summary (Last 24 hours) at 03/09/2019 0724 Last data filed at 03/09/2019 0502 Gross per 24 hour  Intake 3477.3 ml  Output 4090 ml  Net -612.7 ml    Intake/Output this shift: No intake/output data recorded.  Labs: Recent Labs    03/08/19 0610 03/09/19 0300  HGB 14.8 12.5*   Recent Labs    03/08/19 0610 03/09/19 0300  WBC 4.8 9.7  RBC 4.52 3.87*  HCT 42.8 36.5*  PLT 165 160   Recent Labs    03/08/19 0610 03/09/19 0300  NA 138 138  K 2.7* 3.3*  CL 102 104  CO2 26 25  BUN 15 12  CREATININE 1.11 1.01  GLUCOSE 116* 125*  CALCIUM 8.7* 8.2*   Recent Labs    03/08/19 0610  INR 1.1    EXAM General - Patient is Alert, Appropriate and Oriented Extremity - Neurologically intact Neurovascular intact No cellulitis present Compartment soft Dressing - bloody drainage from where hemovac came out. Dressing changed Motor Function - intact, moving foot and toes well on exam.  Hemovac came out yesterday while getting out of chair  Past Medical History:  Diagnosis Date  . GERD (gastroesophageal reflux disease)   . Hypertension     Assessment/Plan: 1 Day Post-Op Procedure(s) (LRB): TOTAL HIP ARTHROPLASTY ANTERIOR APPROACH (Left) Principal Problem:   Avascular necrosis of bone of left hip (HCC) Active Problems:   Avascular necrosis of bone of hip, left (HCC)   Advance diet Up with therapy D/C IV fluids   Discharge home after PT this morning  DVT Prophylaxis - Aspirin Weight Bearing As Tolerated left Leg  Gaynelle Arabian

## 2019-03-09 NOTE — Progress Notes (Signed)
Pt. stated to previous RT that he would have home CPAP brought in for use H/S on 03/09/2019, aware to notify if needed.

## 2019-03-16 NOTE — Discharge Summary (Signed)
Physician Discharge Summary   Patient ID: Zachary James MRN: [id number] DOB/AGE: Jun 22, 1976 43 y.o.  Admit date: 03/08/2019 Discharge date: 03/09/2019  Primary Diagnosis: Osteonecrosis of the left hip   Admission Diagnoses:  Past Medical History:  Diagnosis Date   GERD (gastroesophageal reflux disease)    Hypertension    Discharge Diagnoses:   Principal Problem:   Avascular necrosis of bone of left hip (HCC) Active Problems:   Avascular necrosis of bone of hip, left (HCC)  Estimated body mass index is 33.4 kg/m as calculated from the following:   Height as of this encounter: 6' 2"  (1.88 m).   Weight as of this encounter: 118 kg.  Procedure:  Procedure(s) (LRB): TOTAL HIP ARTHROPLASTY ANTERIOR APPROACH (Left)   Consults: None  HPI: Zachary James is a 43 y.o. male who has advanced end-stage osteonecrosis with collapse of the femoral head of their Left  hip with progressively worsening pain and dysfunction.The patient has failed nonoperative management and presents for total hip arthroplasty.   Laboratory Data: Admission on 03/08/2019, Discharged on 03/09/2019  Component Date Value Ref Range Status   MRSA, PCR 03/08/2019 POSITIVE* NEGATIVE Final   Comment: RESULT CALLED TO, READ BACK BY AND VERIFIED WITH: FRANKLIN RN AT 1102 ON 03/08/2019 BY S.VANHOORNE    Staphylococcus aureus 03/08/2019 POSITIVE* NEGATIVE Final   Comment: (NOTE) The Xpert SA Assay (FDA approved for NASAL specimens in patients 33 years of age and older), is one component of a comprehensive surveillance program. It is not intended to diagnose infection nor to guide or monitor treatment. Performed at [facility], [place] [address], [place], [place] [address]    WBC 03/08/2019 4.8  4.0 - 10.5 K/uL Final   RBC 03/08/2019 4.52  4.22 - 5.81 MIL/uL Final   Hemoglobin 03/08/2019 14.8  13.0 - 17.0 g/dL Final   HCT 03/08/2019 42.8  39.0 - 52.0 % Final   MCV 03/08/2019 94.7  80.0 -  100.0 fL Final   MCH 03/08/2019 32.7  26.0 - 34.0 pg Final   MCHC 03/08/2019 34.6  30.0 - 36.0 g/dL Final   RDW 03/08/2019 13.4  11.5 - 15.5 % Final   Platelets 03/08/2019 165  150 - 400 K/uL Final   nRBC 03/08/2019 0.0  0.0 - 0.2 % Final   Performed at [facility], [place] [address], [place], [place] [address]   Sodium 03/08/2019 138  135 - 145 mmol/L Final   Potassium 03/08/2019 2.7* 3.5 - 5.1 mmol/L Final   Comment: CRITICAL RESULT CALLED TO, READ BACK BY AND VERIFIED WITH: D.HARVELL RN AT 7989 ON 03/08/2019 BY S.VANHOORNE    Chloride 03/08/2019 102  98 - 111 mmol/L Final   CO2 03/08/2019 26  22 - 32 mmol/L Final   Glucose, Bld 03/08/2019 116* 70 - 99 mg/dL Final   BUN 03/08/2019 15  6 - 20 mg/dL Final   Creatinine, Ser 03/08/2019 1.11  0.61 - 1.24 mg/dL Final   Calcium 03/08/2019 8.7* 8.9 - 10.3 mg/dL Final   Total Protein 03/08/2019 6.7  6.5 - 8.1 g/dL Final   Albumin 03/08/2019 4.1  3.5 - 5.0 g/dL Final   AST 03/08/2019 21  15 - 41 U/L Final   ALT 03/08/2019 28  0 - 44 U/L Final   Alkaline Phosphatase 03/08/2019 49  38 - 126 U/L Final   Total Bilirubin 03/08/2019 1.0  0.3 - 1.2 mg/dL Final   GFR calc non Af Amer 03/08/2019 >60  >60 mL/min Final  GFR calc Af Amer 03/08/2019 >60  >60 mL/min Final   Anion gap 03/08/2019 10  5 - 15 Final   Performed at [facility], [place] [address], [place], [place] [address]   Prothrombin Time 03/08/2019 13.6  11.4 - 15.2 seconds Final   INR 03/08/2019 1.1  0.8 - 1.2 Final   Comment: (NOTE) INR goal varies based on device and disease states. Performed at [facility], [place] [address] [place], [place] [address]    aPTT 03/08/2019 28  24 - 36 seconds Final   Performed at [facility], [place] [address], [place], [place] [address]   ABO/RH(D) 03/08/2019 B POS   Final   Antibody Screen 03/08/2019 NEG   Final   Sample Expiration 03/08/2019    Final                     Value:03/11/2019 Performed at [facility], [place] [address], [place], [place] [address]    ABO/RH(D) 03/08/2019    Final                   Value:B POS Performed at [facility], [place] [address], [place], [place] [address]    WBC 03/09/2019 9.7  4.0 - 10.5 K/uL Final   RBC 03/09/2019 3.87* 4.22 - 5.81 MIL/uL Final   Hemoglobin 03/09/2019 12.5* 13.0 - 17.0 g/dL Final   HCT 03/09/2019 36.5* 39.0 - 52.0 % Final   MCV 03/09/2019 94.3  80.0 - 100.0 fL Final   MCH 03/09/2019 32.3  26.0 - 34.0 pg Final   MCHC 03/09/2019 34.2  30.0 - 36.0 g/dL Final   RDW 03/09/2019 13.5  11.5 - 15.5 % Final   Platelets 03/09/2019 160  150 - 400 K/uL Final   nRBC 03/09/2019 0.0  0.0 - 0.2 % Final   Performed at [facility], [place] [address] [place], [place] [address]   Sodium 03/09/2019 138  135 - 145 mmol/L Final   Potassium 03/09/2019 3.3* 3.5 - 5.1 mmol/L Final   DELTA CHECK NOTED   Chloride 03/09/2019 104  98 - 111 mmol/L Final   CO2 03/09/2019 25  22 - 32 mmol/L Final   Glucose, Bld 03/09/2019 125* 70 - 99 mg/dL Final   BUN 03/09/2019 12  6 - 20 mg/dL Final   Creatinine, Ser 03/09/2019 1.01  0.61 - 1.24 mg/dL Final   Calcium 03/09/2019 8.2* 8.9 - 10.3 mg/dL Final   GFR calc non Af Amer 03/09/2019 >60  >60 mL/min Final   GFR calc Af Amer 03/09/2019 >60  >60 mL/min Final   Anion gap 03/09/2019 9  5 - 15 Final   Performed at [facility], [place] [address], [place], [place] [address]     X-Rays:Dg Pelvis Portable  Result Date: 03/08/2019 CLINICAL DATA:  Post left hip replacement EXAM: PORTABLE PELVIS 1-2 VIEWS COMPARISON:  Intraoperative radiographs of the left hip-earlier same day FINDINGS: Post left total hip replacement. Alignment appears anatomic given AP projection. A surgical drain is noted about the superolateral aspect of the operative site. Minimal amount of expected adjacent  subcutaneous emphysema. No radiopaque foreign body. Limited visualization of the pelvis is normal. Suspected mild degenerative change of the contralateral right hip, incompletely evaluated. IMPRESSION: Post left total hip replacement without evidence of complication. Electronically Signed   By: Sandi Mariscal M.D.   On: 03/08/2019 10:11   Dg C-arm 1-60 Min  Result  Date: 03/08/2019 CLINICAL DATA:  Left hip replacement EXAM: OPERATIVE LEFT HIP (WITH PELVIS IF PERFORMED) 6 VIEWS TECHNIQUE: Fluoroscopic spot image(s) were submitted for interpretation post-operatively. FLUOROSCOPY TIME:  12 seconds (1.24 mGy) COMPARISON:  Left hip radiographs-10/19/2018 FINDINGS: Six spot intraoperative fluoroscopic images of the pelvis and left hip are provided for review. Images demonstrate the sequela of left total hip replacement. Alignment appears anatomic given AP projection. No definite fracture or dislocation. There is a minimal amount of subcutaneous emphysema about the operative site. No radiopaque foreign body. Limited visualization of the pelvis and contralateral right hip is unremarkable. IMPRESSION: Post left total hip replacement without evidence of complication Electronically Signed   By: Sandi Mariscal M.D.   On: 03/08/2019 09:48   Dg Hip Operative Unilat W Or W/o Pelvis Left  Result Date: 03/08/2019 CLINICAL DATA:  Left hip replacement EXAM: OPERATIVE LEFT HIP (WITH PELVIS IF PERFORMED) 6 VIEWS TECHNIQUE: Fluoroscopic spot image(s) were submitted for interpretation post-operatively. FLUOROSCOPY TIME:  12 seconds (1.24 mGy) COMPARISON:  Left hip radiographs-10/19/2018 FINDINGS: Six spot intraoperative fluoroscopic images of the pelvis and left hip are provided for review. Images demonstrate the sequela of left total hip replacement. Alignment appears anatomic given AP projection. No definite fracture or dislocation. There is a minimal amount of subcutaneous emphysema about the operative site. No radiopaque foreign  body. Limited visualization of the pelvis and contralateral right hip is unremarkable. IMPRESSION: Post left total hip replacement without evidence of complication Electronically Signed   By: Sandi Mariscal M.D.   On: 03/08/2019 09:48    EKG: Orders placed or performed during the hospital encounter of 03/08/19   EKG 12 lead   EKG 12 lead     Hospital Course: Zachary James is a 43 y.o. who was admitted to [facility]. They were brought to the operating room on 03/08/2019 and underwent Procedure(s): [place].  Patient tolerated the procedure well and was later transferred to the recovery room and then to the orthopaedic floor for postoperative care. They were given PO and IV analgesics for pain control following their surgery. They were given 24 hours of postoperative antibiotics of  Anti-infectives (From admission, onward)   Start     Dose/Rate Route Frequency Ordered Stop   03/08/19 1400  ceFAZolin (ANCEF) IVPB 2g/100 mL premix     2 g 200 mL/hr over 30 Minutes Intravenous Every 6 hours 03/08/19 1037 03/08/19 2126   03/08/19 0600  ceFAZolin (ANCEF) IVPB 2g/100 mL premix     2 g 200 mL/hr over 30 Minutes Intravenous On call to O.R. 03/08/19 0543 03/08/19 0800     and started on DVT prophylaxis in the form of Aspirin.   PT and OT were ordered for total joint protocol. Discharge planning consulted to help with postop disposition and equipment needs.  Patient had a good night on the evening of surgery. They started to get up OOB with therapy on POD #0. Pt was seen during rounds and was ready to go home pending progress with therapy. Hemovac drain was pulled without difficulty. He worked with therapy on POD #1 and was meeting his goals. Pt was discharged to home later that day in stable condition.  Diet: Regular diet Activity: WBAT Follow-up: in 2 weeks Disposition: Home Discharged Condition: stable   Discharge Instructions    Call MD / Call 911    Complete by:  As directed    If you experience chest pain or shortness of breath, CALL 911  and be transported to the hospital emergency room.  If you develope a fever above 101 F, pus (white drainage) or increased drainage or redness at the wound, or calf pain, call your surgeon's office.   Change dressing   Complete by:  As directed    You may change your dressing on Wednesday, then change the dressing daily with sterile 4 x 4 inch gauze dressing and paper tape.   Constipation Prevention   Complete by:  As directed    Drink plenty of fluids.  Prune juice may be helpful.  You may use a stool softener, such as Colace (over the counter) 100 mg twice a day.  Use MiraLax (over the counter) for constipation as needed.   Diet - low sodium heart healthy   Complete by:  As directed    Discharge instructions   Complete by:  As directed    Dr. Gaynelle Arabian Total Joint Specialist [facility] [address] [place], [place] [address] [phone]  ANTERIOR APPROACH TOTAL HIP REPLACEMENT POSTOPERATIVE DIRECTIONS   Hip Rehabilitation, Guidelines Following Surgery  The results of a hip operation are greatly improved after range of motion and muscle strengthening exercises. Follow all safety measures which are given to protect your hip. If any of these exercises cause increased pain or swelling in your joint, decrease the amount until you are comfortable again. Then slowly increase the exercises. Call your caregiver if you have problems or questions.   HOME CARE INSTRUCTIONS  Remove items at home which could result in a fall. This includes throw rugs or furniture in walking pathways.  ICE to the affected hip every three hours for 30 minutes at a time and then as needed for pain and swelling.  Continue to use ice on the hip for pain and swelling from surgery. You may notice swelling that will progress down to the foot and ankle.  This is normal after surgery.  Elevate the leg when you are  not up walking on it.   Continue to use the breathing machine which will help keep your temperature down.  It is common for your temperature to cycle up and down following surgery, especially at night when you are not up moving around and exerting yourself.  The breathing machine keeps your lungs expanded and your temperature down.  DIET You may resume your previous home diet once your are discharged from the hospital.  DRESSING / WOUND CARE / SHOWERING You may change your dressing 3-5 days after surgery.  Then change the dressing every day with sterile gauze.  Please use good hand washing techniques before changing the dressing.  Do not use any lotions or creams on the incision until instructed by your surgeon. You may start showering once you are discharged home but do not submerge the incision under water. Just pat the incision dry and apply a dry gauze dressing on daily. Change the surgical dressing daily and reapply a dry dressing each time.  ACTIVITY Walk with your walker as instructed. Use walker as long as suggested by your caregivers. Avoid periods of inactivity such as sitting longer than an hour when not asleep. This helps prevent blood clots.  You may resume a sexual relationship in one month or when given the OK by your doctor.  You may return to work once you are cleared by your doctor.  Do not drive a car for 6 weeks or until released by you surgeon.  Do not drive while taking narcotics.  WEIGHT BEARING Weight bearing as tolerated with assist device (walker, cane, etc) as directed, use it as long as suggested by your surgeon or therapist, typically at least 4-6 weeks.  POSTOPERATIVE CONSTIPATION PROTOCOL Constipation - defined medically as fewer than three stools per week and severe constipation as less than one stool per week.  One of the most common issues patients have following surgery is constipation.  Even if you have a regular bowel pattern at home, your normal regimen  is likely to be disrupted due to multiple reasons following surgery.  Combination of anesthesia, postoperative narcotics, change in appetite and fluid intake all can affect your bowels.  In order to avoid complications following surgery, here are some recommendations in order to help you during your recovery period.  Colace (docusate) - Pick up an over-the-counter form of Colace or another stool softener and take twice a day as long as you are requiring postoperative pain medications.  Take with a full glass of water daily.  If you experience loose stools or diarrhea, hold the colace until you stool forms back up.  If your symptoms do not get better within 1 week or if they get worse, check with your doctor.  Dulcolax (bisacodyl) - Pick up over-the-counter and take as directed by the product packaging as needed to assist with the movement of your bowels.  Take with a full glass of water.  Use this product as needed if not relieved by Colace only.   MiraLax (polyethylene glycol) - Pick up over-the-counter to have on hand.  MiraLax is a solution that will increase the amount of water in your bowels to assist with bowel movements.  Take as directed and can mix with a glass of water, juice, soda, coffee, or tea.  Take if you go more than two days without a movement. Do not use MiraLax more than once per day. Call your doctor if you are still constipated or irregular after using this medication for 7 days in a row.  If you continue to have problems with postoperative constipation, please contact the office for further assistance and recommendations.  If you experience "the worst abdominal pain ever" or develop nausea or vomiting, please contact the office immediatly for further recommendations for treatment.  ITCHING  If you experience itching with your medications, try taking only a single pain pill, or even half a pain pill at a time.  You can also use Benadryl over the counter for itching or also to help  with sleep.   TED HOSE STOCKINGS Wear the elastic stockings on both legs for three weeks following surgery during the day but you may remove then at night for sleeping.  MEDICATIONS See your medication summary on the "After Visit Summary" that the nursing staff will review with you prior to discharge.  You may have some home medications which will be placed on hold until you complete the course of blood thinner medication.  It is important for you to complete the blood thinner medication as prescribed by your surgeon.  Continue your approved medications as instructed at time of discharge.  PRECAUTIONS If you experience chest pain or shortness of breath - call 911 immediately for transfer to the hospital emergency department.  If you develop a fever greater that 101 F, purulent drainage from wound, increased redness or drainage from wound, foul odor from the wound/dressing, or calf pain - CONTACT YOUR SURGEON.  FOLLOW-UP APPOINTMENTS Make sure you keep all of your appointments after your operation with your surgeon and caregivers. You should call the office at the above phone number and make an appointment for approximately two weeks after the date of your surgery or on the date instructed by your surgeon outlined in the "After Visit Summary".  RANGE OF MOTION AND STRENGTHENING EXERCISES  These exercises are designed to help you keep full movement of your hip joint. Follow your caregiver's or physical therapist's instructions. Perform all exercises about fifteen times, three times per day or as directed. Exercise both hips, even if you have had only one joint replacement. These exercises can be done on a training (exercise) mat, on the floor, on a table or on a bed. Use whatever works the best and is most comfortable for you. Use music or television while you are exercising so that the exercises are a pleasant break in your day. This will make your life  better with the exercises acting as a break in routine you can look forward to.  Lying on your back, slowly slide your foot toward your buttocks, raising your knee up off the floor. Then slowly slide your foot back down until your leg is straight again.  Lying on your back spread your legs as far apart as you can without causing discomfort.  Lying on your side, raise your upper leg and foot straight up from the floor as far as is comfortable. Slowly lower the leg and repeat.  Lying on your back, tighten up the muscle in the front of your thigh (quadriceps muscles). You can do this by keeping your leg straight and trying to raise your heel off the floor. This helps strengthen the largest muscle supporting your knee.  Lying on your back, tighten up the muscles of your buttocks both with the legs straight and with the knee bent at a comfortable angle while keeping your heel on the floor.   IF YOU ARE TRANSFERRED TO A SKILLED REHAB FACILITY If the patient is transferred to a skilled rehab facility following release from the hospital, a list of the current medications will be sent to the facility for the patient to continue.  When discharged from the skilled rehab facility, please have the facility set up the patient's [place] prior to being released. Also, the skilled facility will be responsible for providing the patient with their medications at time of release from the facility to include their pain medication, the muscle relaxants, and their blood thinner medication. If the patient is still at the rehab facility at time of the two week follow up appointment, the skilled rehab facility will also need to assist the patient in arranging follow up appointment in our office and any transportation needs.  MAKE SURE YOU:  Understand these instructions.  Get help right away if you are not doing well or get worse.    Pick up stool softner and laxative for home use following surgery while  on pain medications. Do not submerge incision under water. Please use good hand washing techniques while changing dressing each day. May shower starting three days after surgery. Please use a clean towel to pat the incision dry following showers. Continue to use ice for pain and swelling after surgery. Do not use any lotions or creams on the incision until instructed by your surgeon.   Do not sit on low chairs, stoools or toilet seats, as it may be difficult to get up from low  surfaces   Complete by:  As directed    Driving restrictions   Complete by:  As directed    No driving for two weeks   TED hose   Complete by:  As directed    Use stockings (TED hose) for three weeks on both leg(s).  You may remove them at night for sleeping.   Weight bearing as tolerated   Complete by:  As directed      Allergies as of 03/09/2019      Reactions   Ace Inhibitors Cough      Medication List    STOP taking these medications   naproxen sodium 220 MG tablet Commonly known as:  ALEVE     TAKE these medications   aspirin 325 MG EC tablet Take 1 tablet (325 mg total) by mouth 2 (two) times daily for 20 days. Then take one 81 mg aspirin once a day for three weeks. Then discontinue aspirin.   fluticasone 50 MCG/ACT nasal spray Commonly known as:  FLONASE Place 1 spray into both nostrils daily.   hydrochlorothiazide 25 MG tablet Commonly known as:  HYDRODIURIL Take 25 mg by mouth daily.   HYDROcodone-acetaminophen 5-325 MG tablet Commonly known as:  NORCO/VICODIN Take 1-2 tablets by mouth every 6 (six) hours as needed for severe pain.   loratadine 10 MG tablet Commonly known as:  CLARITIN Take 10 mg by mouth daily.   losartan 100 MG tablet Commonly known as:  COZAAR Take 100 mg by mouth daily.   methocarbamol 500 MG tablet Commonly known as:  ROBAXIN Take 1 tablet (500 mg total) by mouth every 6 (six) hours as needed for muscle spasms.   pantoprazole 40 MG tablet Commonly known  as:  PROTONIX Take 40 mg by mouth daily.   testosterone cypionate 100 MG/ML injection Commonly known as:  DEPOTESTOTERONE CYPIONATE Inject 100 mg into the muscle every 14 (fourteen) days. For IM use only Notes to patient:  Take as prescribed   traMADol 50 MG tablet Commonly known as:  ULTRAM Take 1-2 tablets (50-100 mg total) by mouth every 6 (six) hours as needed for moderate pain.   traZODone 50 MG tablet Commonly known as:  DESYREL Take 50-100 mg by mouth at bedtime.            Discharge Care Instructions  (From admission, onward)         Start     Ordered   03/09/19 0000  Weight bearing as tolerated     03/09/19 0804   03/09/19 0000  Change dressing    Comments:  You may change your dressing on Wednesday, then change the dressing daily with sterile 4 x 4 inch gauze dressing and paper tape.   03/09/19 0804         Follow-up Information    Gaynelle Arabian, MD. Schedule an appointment as soon as possible for a visit on 03/23/2019.   Specialty:  Orthopedic Surgery Why:  Call [phone] tomorrow to make the appointment Contact information: [address] [place] [address] [phone]           Signed: Theresa Duty, PA-C Orthopedic Surgery 03/16/2019, 7:58 AM

## 2019-04-16 DIAGNOSIS — M87852 Other osteonecrosis, left femur: Secondary | ICD-10-CM | POA: Diagnosis not present

## 2020-09-19 IMAGING — DX PORTABLE PELVIS 1-2 VIEWS
1 series · 1 of 1 positions shown · non-contrast
Comparison: Intraoperative radiographs of the left hip-earlier same
day

CLINICAL DATA: Post left hip replacement

EXAM:
PORTABLE PELVIS 1-2 VIEWS

[pelvis ap]
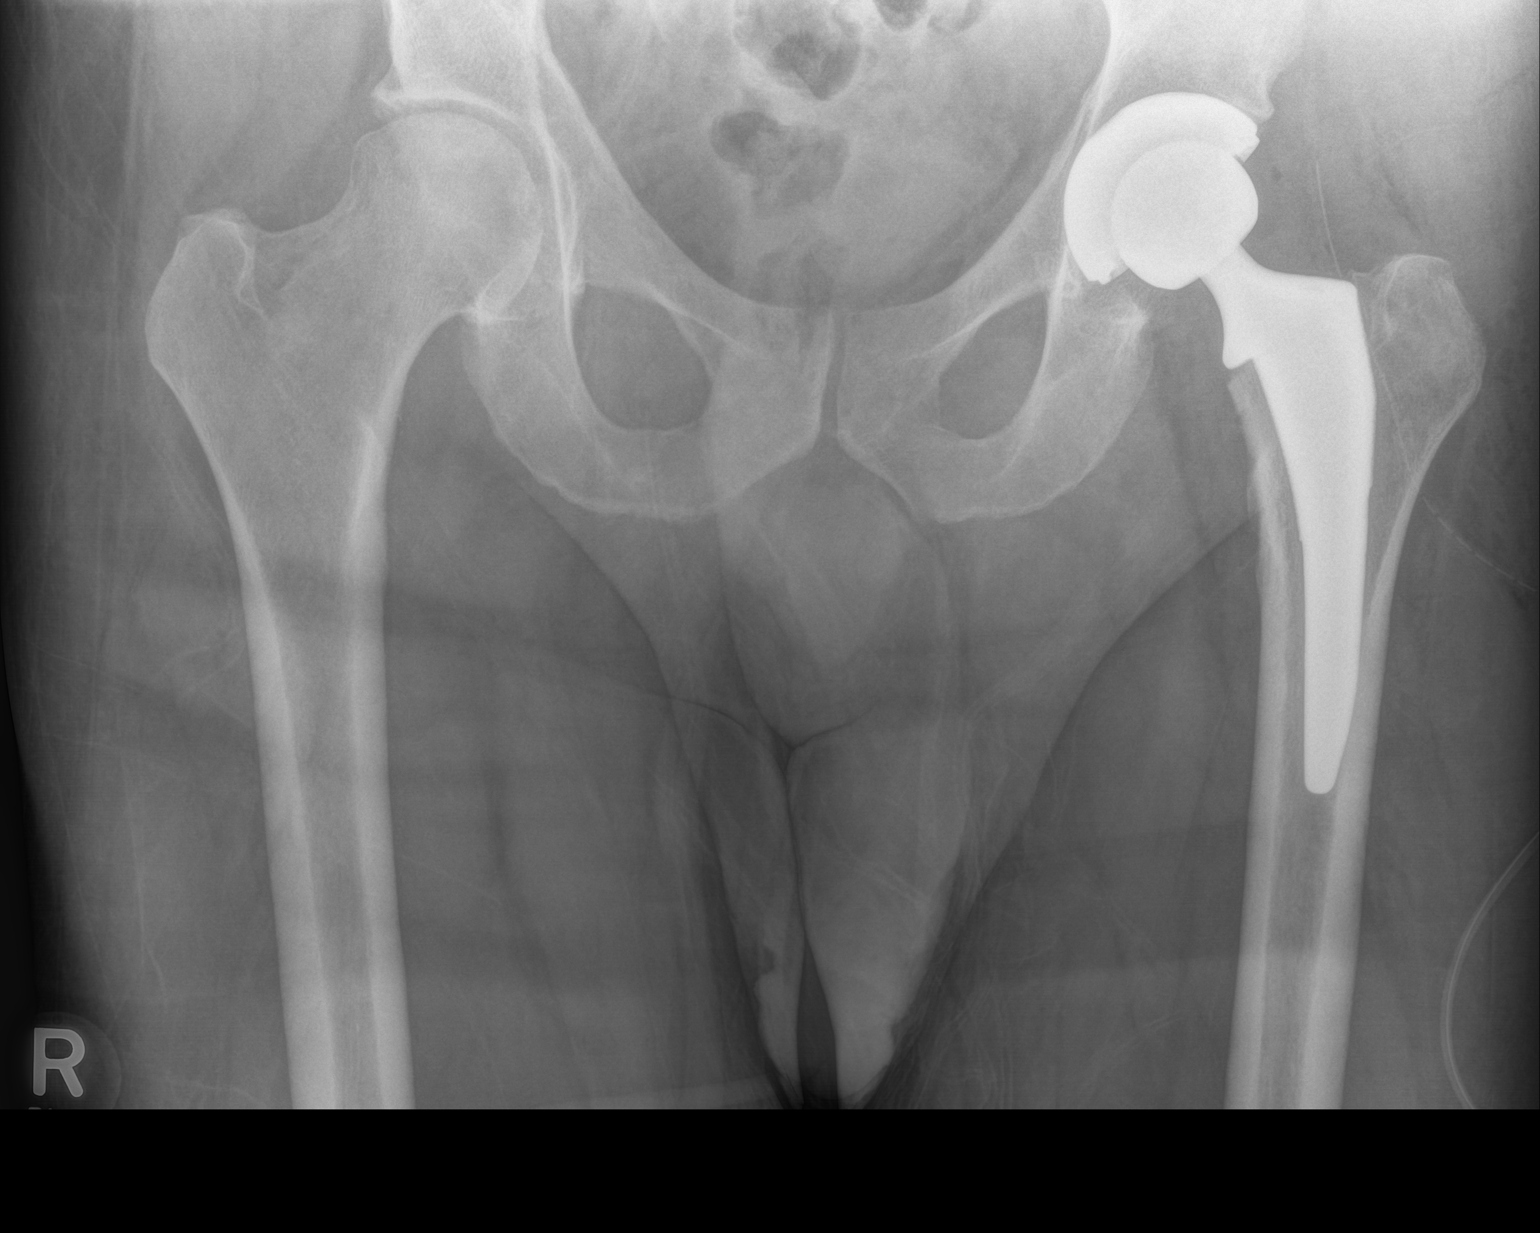

[1 of 1 positions shown; findings below may reference images not displayed]

FINDINGS: Post left total hip replacement. Alignment appears anatomic given AP
projection. A surgical drain is noted about the superolateral aspect
of the operative site. Minimal amount of expected adjacent
subcutaneous emphysema. No radiopaque foreign body.

Limited visualization of the pelvis is normal. Suspected mild
degenerative change of the contralateral right hip, incompletely
evaluated.
IMPRESSION: Post left total hip replacement without evidence of complication.
